# Patient Record
Sex: Female | Born: 1990 | Race: Black or African American | Hispanic: No | Marital: Married | Smoking: Current some day smoker
Health system: Southern US, Community
[De-identification: ages and names within clinical notes are randomized; demographics above are authoritative.]

## PROBLEM LIST (undated history)

## (undated) DIAGNOSIS — M1990 Unspecified osteoarthritis, unspecified site: Secondary | ICD-10-CM

## (undated) DIAGNOSIS — N809 Endometriosis, unspecified: Secondary | ICD-10-CM

## (undated) DIAGNOSIS — F319 Bipolar disorder, unspecified: Secondary | ICD-10-CM

## (undated) DIAGNOSIS — I10 Essential (primary) hypertension: Secondary | ICD-10-CM

## (undated) DIAGNOSIS — K219 Gastro-esophageal reflux disease without esophagitis: Secondary | ICD-10-CM

## (undated) DIAGNOSIS — R519 Headache, unspecified: Secondary | ICD-10-CM

## (undated) HISTORY — DX: Essential (primary) hypertension: I10

## (undated) HISTORY — PX: OTHER SURGICAL HISTORY: SHX169

---

## 2011-05-26 ENCOUNTER — Encounter (HOSPITAL_COMMUNITY): Payer: Self-pay | Admitting: *Deleted

## 2011-05-26 ENCOUNTER — Emergency Department (HOSPITAL_COMMUNITY)
Admission: EM | Admit: 2011-05-26 | Discharge: 2011-05-26 | Disposition: A | Discharge status: Home / Self Care | Payer: Self-pay | Attending: Emergency Medicine | Admitting: Emergency Medicine

## 2011-05-26 DIAGNOSIS — J039 Acute tonsillitis, unspecified: Secondary | ICD-10-CM

## 2011-05-26 DIAGNOSIS — J029 Acute pharyngitis, unspecified: Secondary | ICD-10-CM | POA: Insufficient documentation

## 2011-05-26 DIAGNOSIS — E119 Type 2 diabetes mellitus without complications: Secondary | ICD-10-CM | POA: Insufficient documentation

## 2011-05-26 DIAGNOSIS — R5381 Other malaise: Secondary | ICD-10-CM | POA: Insufficient documentation

## 2011-05-26 DIAGNOSIS — F319 Bipolar disorder, unspecified: Secondary | ICD-10-CM | POA: Insufficient documentation

## 2011-05-26 DIAGNOSIS — R51 Headache: Secondary | ICD-10-CM | POA: Insufficient documentation

## 2011-05-26 HISTORY — DX: Unspecified osteoarthritis, unspecified site: M19.90

## 2011-05-26 HISTORY — DX: Bipolar disorder, unspecified: F31.9

## 2011-05-26 LAB — RAPID STREP SCREEN (MED CTR MEBANE ONLY): Streptococcus, Group A Screen (Direct): NEGATIVE

## 2011-05-26 LAB — MONONUCLEOSIS SCREEN: Mono Screen: NEGATIVE

## 2011-05-26 MED ORDER — LIDOCAINE VISCOUS 2 % MT SOLN: 10.0000 mL | OROMUCOSAL | Status: AC | PRN

## 2011-05-26 MED ORDER — LIDOCAINE VISCOUS 2 % MT SOLN
20.0000 mL | Freq: Once | OROMUCOSAL | Status: AC
  Administered 2011-05-26: 20 mL via OROMUCOSAL
  Filled 2011-05-26: qty 30

## 2011-05-26 MED ORDER — AZITHROMYCIN 200 MG/5ML PO SUSR: 240.0000 mg | Freq: Every day | ORAL | Status: AC

## 2011-05-26 MED ORDER — AZITHROMYCIN 200 MG/5ML PO SUSR
500.0000 mg | Freq: Once | ORAL | Status: AC
  Administered 2011-05-26: 500 mg via ORAL
  Filled 2011-05-26 (×2): qty 10

## 2011-05-26 NOTE — Discharge Instructions (Signed)
Tonsillitis Tonsils are lumps of lymphoid tissues at the back of the throat. Each tonsil has 20 crevices (crypts). Tonsils help fight nose and throat infections and keep infection from spreading to other parts of the body for the first 18 months of life. Tonsillitis is an infection of the throat that causes the tonsils to become red, tender, and swollen. CAUSES Sudden and, if treated, temporary (acute) tonsillitis is usually caused by infection with streptococcal bacteria. Long lasting (chronic) tonsillitis occurs when the crypts of the tonsils become filled with pieces of food and bacteria, which makes it easy for the tonsils to become constantly infected. SYMPTOMS  Symptoms of tonsillitis include:  A sore throat.   White patches on the tonsils.   Fever.   Tiredness.  DIAGNOSIS Tonsillitis can be diagnosed through a physical exam. Diagnosis can be confirmed with the results of lab tests, including a throat culture. TREATMENT  The goals of tonsillitis treatment include the reduction of the severity and duration of symptoms, prevention of associated conditions, and prevention of disease transmission. Tonsillitis caused by bacteria can be treated with antibiotics. Usually, treatment with antibiotics is started before the cause of the tonsillitis is known. However, if it is determined that the cause is not bacterial, antibiotics will not treat the tonsillitis. If attacks of tonsillitis are severe and frequent, your caregiver may recommend surgery to remove the tonsils (tonsillectomy). HOME CARE INSTRUCTIONS   Rest as much as possible and get plenty of sleep.   Drink plenty of fluids. While the throat is very sore, eat soft foods or liquids, such as sherbet, soups, or instant breakfast drinks.   Eat frozen ice pops.   Older children and adults may gargle with a warm or cold liquid to help soothe the throat. Mix 1 teaspoon of salt in 1 cup of water.   Other family members who also develop a  sore throat or fever should have a medical exam or throat culture.   Only take over-the-counter or prescription medicines for pain, discomfort, or fever as directed by your caregiver.   If you are given antibiotics, take them as directed. Finish them even if you start to feel better.  SEEK MEDICAL CARE IF:   Your baby is older than 3 months with a rectal temperature of 100.5 F (38.1 C) or higher for more than 1 day.   Large, tender lumps develop in your neck.   A rash develops.   Green, yellow-brown, or bloody substance is coughed up.   You are unable to swallow liquids or food for 24 hours.   Your child is unable to swallow food or liquids for 12 hours.  SEEK IMMEDIATE MEDICAL CARE IF:   You develop any new symptoms such as vomiting, severe headache, stiff neck, chest pain, or trouble breathing or swallowing.   You have severe throat pain along with drooling or voice changes.   You have severe pain, unrelieved with recommended medications.   You are unable to fully open the mouth.   You develop redness, swelling, or severe pain anywhere in the neck.   You have a fever.   Your baby is older than 3 months with a rectal temperature of 102 F (38.9 C) or higher.   Your baby is 12 months old or younger with a rectal temperature of 100.4 F (38 C) or higher.  MAKE SURE YOU:   Understand these instructions.   Will watch your condition.   Will get help right away if you are not  watch your condition.   Will get help right away if you are not doing well or get worse.  Document Released: 10/12/2004 Document Revised: 12/22/2010 Document Reviewed: 03/10/2010  [employer] Patient Information 2012 [employer].

## 2011-05-26 NOTE — ED Notes (Signed)
Sore throat for 1-2 mos, Had strep test at [facility]  2 weeks ago was neg .  But was treated. Says tonsils are swollen.

## 2011-05-27 NOTE — ED Provider Notes (Signed)
History     CSN: [id number]  Arrival date & time 05/26/11  1756   First MD Initiated Contact with Patient 05/26/11 1814      Chief Complaint  Patient presents with  . Sore Throat    (Consider location/radiation/quality/duration/timing/severity/associated sxs/prior treatment) HPI Comments: Aryanah Enslow presents with a near chronic sore throat,  Swollen tonsils with white tonsils for the past 6-8 weeks.  She was seen at [facility] 2 weeks ago and reports her strep test was negative but was treated with a 10 day course of amoxil.  She never improved,  Stating her her throat is still sore,  With swollen tonsils with white patches.  She also reports subjective fever and constant fatigue.  She denies cough,  Sob,  Chest or abdominal pain.  She has not had nasal congestion ear pain but has had occasional headaches.  She is a diabetic and states her blood glucose levels have been less than 200.  The history is provided by the patient.    Past Medical History  Diagnosis Date  . Diabetes mellitus   . Arthritis   . Bipolar 1 disorder     History reviewed. No pertinent past surgical history.  History reviewed. No pertinent family history.  History  Substance Use Topics  . Smoking status: Current Everyday Smoker  . Smokeless tobacco: Not on file  . Alcohol Use: No    OB History    Grav Para Term Preterm Abortions TAB SAB Ect Mult Living                  Review of Systems  Constitutional: Positive for fatigue. Negative for fever.  HENT: Positive for sore throat. Negative for congestion, rhinorrhea, neck pain and ear discharge.   Eyes: Negative.   Respiratory: Negative for chest tightness and shortness of breath.   Cardiovascular: Negative for chest pain.  Gastrointestinal: Negative for nausea and abdominal pain.  Genitourinary: Negative.   Musculoskeletal: Negative for joint swelling and arthralgias.  Skin: Negative.  Negative for rash and wound.  Neurological:  Positive for headaches. Negative for dizziness, weakness, light-headedness and numbness.  Hematological: Negative.   Psychiatric/Behavioral: Negative.     Allergies  Other  Home Medications   Current Outpatient Rx  Name Route Sig Dispense Refill  . IBUPROFEN 200 MG PO TABS Oral Take 400-600 mg by mouth daily as needed. For pain    . SITAGLIPTIN PHOSPHATE 100 MG PO TABS Oral Take 100 mg by mouth daily.    . AZITHROMYCIN 200 MG/5ML PO SUSR Oral Take 6 mLs (240 mg total) by mouth daily. Take for 4 days 24 mL 0  . LIDOCAINE VISCOUS 2 % MT SOLN Oral Take 10 mLs by mouth as needed for pain. Gargle and spit 100 mL 0    BP 135/63  Pulse 97  Temp(Src) 99.8 F (37.7 C) (Oral)  Resp 16  Ht 5\' 8"  (1.727 m)  Wt 265 lb (120.203 kg)  BMI 40.29 kg/m2  SpO2 100%  LMP 05/17/2011  Physical Exam  Nursing note and vitals reviewed. Constitutional: She appears well-developed and well-nourished.  HENT:  Head: Normocephalic and atraumatic.  Right Ear: Tympanic membrane normal.  Left Ear: Tympanic membrane normal.  Nose: No mucosal edema or rhinorrhea.  Mouth/Throat: Uvula is midline and mucous membranes are normal. Oropharyngeal exudate and posterior oropharyngeal erythema present. No posterior oropharyngeal edema or tonsillar abscesses.  Eyes: Conjunctivae are normal.  Neck: Normal range of motion.  Cardiovascular: Normal rate, regular rhythm, normal  heart sounds and intact distal pulses.   Pulmonary/Chest: Effort normal and breath sounds normal. She has no wheezes.  Abdominal: Soft. Bowel sounds are normal. There is no tenderness.  Musculoskeletal: Normal range of motion.  Lymphadenopathy:       Head (right side): Tonsillar and posterior auricular adenopathy present.       Head (left side): Tonsillar and posterior auricular adenopathy present.  Neurological: She is alert.  Skin: Skin is warm and dry.  Psychiatric: She has a normal mood and affect.    ED Course  Procedures (including  critical care time)   Labs Reviewed  MONONUCLEOSIS SCREEN  RAPID STREP SCREEN  LAB REPORT - SCANNED   No results found.   1. Tonsillitis       MDM  Labs reviewed prior to dc home.  Patient placed on zpack,  First 500 mg dose given in ed.  Lidocaine 2% viscous.  Referral to pcp for a recheck if not improving.  No PE evidence for peritonsillar abscess.  No respiratory distress or stridor.        Burgess Amor, PA 05/27/11 2203  Burgess Amor, PA 05/27/11 2204

## 2011-05-28 NOTE — ED Provider Notes (Signed)
Medical screening examination/treatment/procedure(s) were performed by non-physician practitioner and as supervising physician I was immediately available for consultation/collaboration.  Donnetta Hutching, MD 05/28/11 1610

## 2014-02-03 ENCOUNTER — Emergency Department (HOSPITAL_COMMUNITY)
Admission: EM | Admit: 2014-02-03 | Discharge: 2014-02-03 | Disposition: A | Discharge status: Home / Self Care | Payer: Self-pay | Attending: Emergency Medicine | Admitting: Emergency Medicine

## 2014-02-03 ENCOUNTER — Encounter (HOSPITAL_COMMUNITY): Payer: Self-pay | Admitting: Physical Medicine and Rehabilitation

## 2014-02-03 DIAGNOSIS — Y998 Other external cause status: Secondary | ICD-10-CM | POA: Insufficient documentation

## 2014-02-03 DIAGNOSIS — Y9389 Activity, other specified: Secondary | ICD-10-CM | POA: Insufficient documentation

## 2014-02-03 DIAGNOSIS — S80862A Insect bite (nonvenomous), left lower leg, initial encounter: Secondary | ICD-10-CM | POA: Insufficient documentation

## 2014-02-03 DIAGNOSIS — Z8659 Personal history of other mental and behavioral disorders: Secondary | ICD-10-CM | POA: Insufficient documentation

## 2014-02-03 DIAGNOSIS — Y9289 Other specified places as the place of occurrence of the external cause: Secondary | ICD-10-CM | POA: Insufficient documentation

## 2014-02-03 DIAGNOSIS — B349 Viral infection, unspecified: Secondary | ICD-10-CM | POA: Insufficient documentation

## 2014-02-03 DIAGNOSIS — Z720 Tobacco use: Secondary | ICD-10-CM | POA: Insufficient documentation

## 2014-02-03 DIAGNOSIS — Z79899 Other long term (current) drug therapy: Secondary | ICD-10-CM | POA: Insufficient documentation

## 2014-02-03 DIAGNOSIS — J0100 Acute maxillary sinusitis, unspecified: Secondary | ICD-10-CM | POA: Insufficient documentation

## 2014-02-03 DIAGNOSIS — E119 Type 2 diabetes mellitus without complications: Secondary | ICD-10-CM | POA: Insufficient documentation

## 2014-02-03 DIAGNOSIS — W57XXXA Bitten or stung by nonvenomous insect and other nonvenomous arthropods, initial encounter: Secondary | ICD-10-CM | POA: Insufficient documentation

## 2014-02-03 DIAGNOSIS — Z8739 Personal history of other diseases of the musculoskeletal system and connective tissue: Secondary | ICD-10-CM | POA: Insufficient documentation

## 2014-02-03 MED ORDER — PROMETHAZINE HCL 25 MG PO TABS: 25.0000 mg | ORAL_TABLET | Freq: Four times a day (QID) | ORAL | Status: DC | PRN

## 2014-02-03 MED ORDER — SULFAMETHOXAZOLE-TRIMETHOPRIM 800-160 MG PO TABS: 1.0000 | ORAL_TABLET | Freq: Two times a day (BID) | ORAL | Status: DC

## 2014-02-03 NOTE — ED Notes (Signed)
Declined W/C at D/C and was escorted to lobby by RN. 

## 2014-02-03 NOTE — ED Provider Notes (Signed)
CSN: [id number]     Arrival date & time 02/03/14  0830 History  This chart was scribed for non-physician practitioner, Alvina Chou, PA-C, working with Leota Jacobsen, MD by Ladene Artist, ED Scribe. This patient was seen in room TR11C/TR11C and the patient's care was started at 9:35 AM.   Chief Complaint  Patient presents with  . Insect Bite   The history is provided by the patient. No language interpreter was used.   HPI Comments: Tara Estes is a 24 y.o. female, with a h/o DM, bipolar disorder, arthritis, who presents to the Emergency Department complaining of alleged insect bite to L lower leg first noted a few days ago. Pt reports associated pain that radiates from L knee to L toes. Pt describes pain as a numb, aching sensation. Pain is exacerbated with sitting.   Pt also presents with multiple episodes of emesis over the past 3 days. She reports associated productive cough, sinus pressure, postnasal drip, intermittent nausea. Pt has been treating with Alka seltzer day and night without relief.   Past Medical History  Diagnosis Date  . Diabetes mellitus   . Arthritis   . Bipolar 1 disorder    History reviewed. No pertinent past surgical history. No family history on file. History  Substance Use Topics  . Smoking status: Current Every Day Smoker    Types: Cigarettes  . Smokeless tobacco: Not on file  . Alcohol Use: Yes   OB History    No data available     Review of Systems  HENT: Positive for postnasal drip and sinus pressure.   Respiratory: Positive for cough.   Gastrointestinal: Positive for nausea and vomiting.  Musculoskeletal: Positive for myalgias.  Skin: Positive for rash.  Neurological: Positive for numbness.  All other systems reviewed and are negative.  Allergies  Other  Home Medications   Prior to Admission medications   Medication Sig Start Date End Date Taking? Authorizing Provider  ibuprofen (ADVIL,MOTRIN) 200 MG tablet Take 400-600 mg by  mouth daily as needed. For pain    Historical Provider, MD  sitaGLIPtin (JANUVIA) 100 MG tablet Take 100 mg by mouth daily.    Historical Provider, MD   Triage Vitals: BP 144/76 mmHg  Pulse 93  Temp(Src) 97.7 F (36.5 C) (Oral)  Resp 18  Ht 5\' 8"  (1.727 m)  Wt 280 lb (127.007 kg)  BMI 42.58 kg/m2  SpO2 100% Physical Exam  Constitutional: She is oriented to person, place, and time. She appears well-developed and well-nourished. No distress.  HENT:  Head: Normocephalic and atraumatic.  Nose: Right sinus exhibits maxillary sinus tenderness. Left sinus exhibits maxillary sinus tenderness.  Mouth/Throat: Oropharynx is clear and moist. No oropharyngeal exudate, posterior oropharyngeal edema or posterior oropharyngeal erythema.  Eyes: Conjunctivae and EOM are normal.  Neck: Neck supple.  Cardiovascular: Normal rate, regular rhythm and normal heart sounds.   Pulmonary/Chest: Effort normal and breath sounds normal.  Musculoskeletal: Normal range of motion.  Lymphadenopathy:    She has no cervical adenopathy.  Neurological: She is alert and oriented to person, place, and time.  Skin: Skin is warm and dry.  2 x 1 cm area of localized erythema and edema of the L lateral leg just distal to the knee. No tenderness to palpation or open wound.   Psychiatric: She has a normal mood and affect. Her behavior is normal.  Nursing note and vitals reviewed.  ED Course  Procedures (including critical care time) DIAGNOSTIC STUDIES: Oxygen Saturation is 100% on  RA, normal by my interpretation.    COORDINATION OF CARE: 9:40 AM-Discussed treatment plan which includes Septra DS and Phenergan with pt at bedside and pt agreed to plan.   Labs Review Labs Reviewed - No data to display  Imaging Review No results found.   EKG Interpretation None      MDM   Final diagnoses:  Acute maxillary sinusitis, recurrence not specified  Viral illness  Insect bite    Patient will have Bactrim and Phenergan  for symptoms. Vitals stable and patient afebrile. No further evaluation needed at this time.   I personally performed the services described in this documentation, which was scribed in my presence. The recorded information has been reviewed and is accurate.    Alvina Chou, PA-C 02/05/14 2036  Leota Jacobsen, MD 02/08/14 [phone]

## 2014-02-03 NOTE — ED Notes (Signed)
Pt presents to department for evaluation of possible insect bite to L lower leg. Noticed several days ago. Also states multiple insect bites all over body recently. Small red swollen area noted to L lower leg. Pt is alert and oriented x4.

## 2014-02-03 NOTE — Discharge Instructions (Signed)
Take Bactrim as directed until gone. Take Phenergan as needed for nausea. Refer to attached documents for more information. Return to the ED with worsening or concerning symptoms.

## 2014-11-01 ENCOUNTER — Emergency Department (HOSPITAL_COMMUNITY): Payer: Worker's Compensation

## 2014-11-01 ENCOUNTER — Encounter (HOSPITAL_COMMUNITY): Payer: Self-pay | Admitting: Nurse Practitioner

## 2014-11-01 ENCOUNTER — Emergency Department (HOSPITAL_COMMUNITY)
Admission: EM | Admit: 2014-11-01 | Discharge: 2014-11-01 | Disposition: A | Discharge status: Home / Self Care | Payer: Worker's Compensation | Attending: Emergency Medicine | Admitting: Emergency Medicine

## 2014-11-01 DIAGNOSIS — S3992XA Unspecified injury of lower back, initial encounter: Secondary | ICD-10-CM | POA: Insufficient documentation

## 2014-11-01 DIAGNOSIS — M25519 Pain in unspecified shoulder: Secondary | ICD-10-CM

## 2014-11-01 DIAGNOSIS — Z720 Tobacco use: Secondary | ICD-10-CM | POA: Diagnosis not present

## 2014-11-01 DIAGNOSIS — Z8659 Personal history of other mental and behavioral disorders: Secondary | ICD-10-CM | POA: Insufficient documentation

## 2014-11-01 DIAGNOSIS — Z792 Long term (current) use of antibiotics: Secondary | ICD-10-CM | POA: Diagnosis not present

## 2014-11-01 DIAGNOSIS — M1990 Unspecified osteoarthritis, unspecified site: Secondary | ICD-10-CM | POA: Diagnosis not present

## 2014-11-01 DIAGNOSIS — Y998 Other external cause status: Secondary | ICD-10-CM | POA: Diagnosis not present

## 2014-11-01 DIAGNOSIS — Z79899 Other long term (current) drug therapy: Secondary | ICD-10-CM | POA: Insufficient documentation

## 2014-11-01 DIAGNOSIS — S4992XA Unspecified injury of left shoulder and upper arm, initial encounter: Secondary | ICD-10-CM | POA: Diagnosis not present

## 2014-11-01 DIAGNOSIS — W208XXA Other cause of strike by thrown, projected or falling object, initial encounter: Secondary | ICD-10-CM | POA: Insufficient documentation

## 2014-11-01 DIAGNOSIS — S199XXA Unspecified injury of neck, initial encounter: Secondary | ICD-10-CM | POA: Diagnosis not present

## 2014-11-01 DIAGNOSIS — E119 Type 2 diabetes mellitus without complications: Secondary | ICD-10-CM | POA: Diagnosis not present

## 2014-11-01 DIAGNOSIS — M542 Cervicalgia: Secondary | ICD-10-CM

## 2014-11-01 DIAGNOSIS — Y9289 Other specified places as the place of occurrence of the external cause: Secondary | ICD-10-CM | POA: Insufficient documentation

## 2014-11-01 DIAGNOSIS — Y9389 Activity, other specified: Secondary | ICD-10-CM | POA: Diagnosis not present

## 2014-11-01 MED ORDER — IBUPROFEN 600 MG PO TABS: 600.0000 mg | ORAL_TABLET | Freq: Four times a day (QID) | ORAL | Status: DC | PRN

## 2014-11-01 MED ORDER — KETOROLAC TROMETHAMINE 60 MG/2ML IM SOLN
60.0000 mg | Freq: Once | INTRAMUSCULAR | Status: AC
  Administered 2014-11-01: 60 mg via INTRAMUSCULAR
  Filled 2014-11-01: qty 2

## 2014-11-01 NOTE — Discharge Instructions (Signed)
You were evaluated in the ED today for your left shoulder and neck pain. There does not appear to be an emergent cause for her symptoms at this time. Ear exam was reassuring. Her x-ray showed no broken bones or dislocations. Please continue taking Motrin for your discomfort and swelling. You may also benefit from hot showers to help with the pain. Follow-up with your doctor as needed. Return to ED for worsening symptoms.  Musculoskeletal Pain Musculoskeletal pain is muscle and boney aches and pains. These pains can occur in any part of the body. Your caregiver may treat you without knowing the cause of the pain. They may treat you if blood or urine tests, X-rays, and other tests were normal.  CAUSES There is often not a definite cause or reason for these pains. These pains may be caused by a type of germ (virus). The discomfort may also come from overuse. Overuse includes working out too hard when your body is not fit. Boney aches also come from weather changes. Bone is sensitive to atmospheric pressure changes. HOME CARE INSTRUCTIONS   Ask when your test results will be ready. Make sure you get your test results.  Only take over-the-counter or prescription medicines for pain, discomfort, or fever as directed by your caregiver. If you were given medications for your condition, do not drive, operate machinery or power tools, or sign legal documents for 24 hours. Do not drink alcohol. Do not take sleeping pills or other medications that may interfere with treatment.  Continue all activities unless the activities cause more pain. When the pain lessens, slowly resume normal activities. Gradually increase the intensity and duration of the activities or exercise.  During periods of severe pain, bed rest may be helpful. Lay or sit in any position that is comfortable.  Putting ice on the injured area.  Put ice in a bag.  Place a towel between your skin and the bag.  Leave the ice on for 15 to 20  minutes, 3 to 4 times a day.  Follow up with your caregiver for continued problems and no reason can be found for the pain. If the pain becomes worse or does not go away, it may be necessary to repeat tests or do additional testing. Your caregiver may need to look further for a possible cause. SEEK IMMEDIATE MEDICAL CARE IF:  You have pain that is getting worse and is not relieved by medications.  You develop chest pain that is associated with shortness or breath, sweating, feeling sick to your stomach (nauseous), or throw up (vomit).  Your pain becomes localized to the abdomen.  You develop any new symptoms that seem different or that concern you. MAKE SURE YOU:   Understand these instructions.  Will watch your condition.  Will get help right away if you are not doing well or get worse.   This information is not intended to replace advice given to you by your health care provider. Make sure you discuss any questions you have with your health care provider.   Document Released: 01/02/2005 Document Revised: 03/27/2011 Document Reviewed: 09/06/2012 Elsevier Interactive Patient Education [employer].

## 2014-11-01 NOTE — ED Provider Notes (Signed)
CSN: [id number]     Arrival date & time 11/01/14  1846 History  By signing my name below, I, Tara Estes, attest that this documentation has been prepared under the direction and in the presence of [employer], PA-C. Electronically Signed: Randa Estes, ED Scribe. 11/01/2014. 8:51 PM.      Chief Complaint  Patient presents with  . Back Pain   Patient is a 24 y.o. female presenting with back pain. The history is provided by the patient. No language interpreter was used.  Back Pain  HPI Comments: Tara Estes is a 24 y.o. female who presents to the Emergency Department complaining of burning back pain after having a bakers rack fell onto her 1 day ago. Pt states that as the rack was falling she tried to catch it and thinks she may have pulled something due to being in an awkward position. Pt states she has tried ibuprofen with slight relief. Pt states she has also tried [place].  Pt states that the pain was worse today when she started moving again at work.  Currently rates discomfort as a 5/10. Pain characterized as sharp and burning. Denies fevers, chills, numbness or weakness.  Past Medical History  Diagnosis Date  . Diabetes mellitus   . Arthritis   . Bipolar 1 disorder ([place])    History reviewed. No pertinent past surgical history. No family history on file. Social History  Substance Use Topics  . Smoking status: Current Every Day Smoker    Types: Cigarettes  . Smokeless tobacco: None  . Alcohol Use: Yes   OB History    No data available     Review of Systems  Musculoskeletal: Positive for back pain.  All other systems reviewed and are negative.     Allergies  Other  Home Medications   Prior to Admission medications   Medication Sig Start Date End Date Taking? Authorizing Provider  ibuprofen (ADVIL,MOTRIN) 600 MG tablet Take 1 tablet (600 mg total) by mouth every 6 (six) hours as needed. 11/01/14   Comer Locket, PA-C  promethazine (PHENERGAN) 25  MG tablet Take 1 tablet (25 mg total) by mouth every 6 (six) hours as needed for nausea or vomiting. 02/03/14   Kaitlyn Szekalski, PA-C  sitaGLIPtin (JANUVIA) 100 MG tablet Take 100 mg by mouth daily.    Historical Provider, MD  sulfamethoxazole-trimethoprim (SEPTRA DS) 800-160 MG per tablet Take 1 tablet by mouth every 12 (twelve) hours. 02/03/14   Kaitlyn Szekalski, PA-C   BP 136/73 mmHg  Pulse 88  Temp(Src) 99 F (37.2 C) (Oral)  Resp 16  Ht 5\' 7"  (1.702 m)  Wt 275 lb (124.739 kg)  BMI 43.06 kg/m2  SpO2 100%  LMP 10/18/2014   Physical Exam  Constitutional: She is oriented to person, place, and time. She appears well-developed and well-nourished. No distress.  Awake, alert, nontoxic appearance. Obese, African-American female  HENT:  Head: Normocephalic and atraumatic.  Eyes: Conjunctivae and EOM are normal. Right eye exhibits no discharge. Left eye exhibits no discharge.  Neck: Normal range of motion. Neck supple. No tracheal deviation present.  Cardiovascular: Normal rate, normal heart sounds and intact distal pulses.   Pulmonary/Chest: Effort normal. No respiratory distress.  Abdominal: Soft. There is no tenderness. There is no rebound.  Musculoskeletal: Normal range of motion. She exhibits no tenderness.  Full active range of motion of bilateral upper extremities, CTL-spine. Tenderness diffusely throughout left trapezius and left scapular region. No erythema, lesions or other deformities noted. No bony crepitus. No  step-offs. No midline bony tenderness.  Neurological: She is alert and oriented to person, place, and time.  Mental status and motor strength appears baseline for patient and situation. Sensation intact to light touch. Gait baseline  Skin: Skin is warm and dry. No rash noted.  Psychiatric: She has a normal mood and affect. Her behavior is normal.  Nursing note and vitals reviewed.   ED Course  Procedures (including critical care time) DIAGNOSTIC STUDIES: Oxygen  Saturation is 100% on RA, normal by my interpretation.    COORDINATION OF CARE: 7:17 PM-Discussed treatment plan with pt at bedside and pt agreed to plan.     Labs Review Labs Reviewed - No data to display  Imaging Review Dg Scapula Left  11/01/2014  CLINICAL DATA:  complaining of burning back pain after having a bakers rack fell onto her yesterday. Pt states that as the rack was falling she tried to catch it and thinks she may have pulled something due to being in an awkward position. EXAM: LEFT SCAPULA - 2+ VIEWS COMPARISON:  None. FINDINGS: There is no evidence of fracture or other focal bone lesions. Soft tissues are unremarkable. IMPRESSION: Negative. Electronically Signed   By: Lajean Manes M.D.   On: 11/01/2014 20:37      EKG Interpretation None     Meds given in ED:  Medications  ketorolac (TORADOL) injection 60 mg (60 mg Intramuscular Given 11/01/14 1937)    New Prescriptions   IBUPROFEN (ADVIL,MOTRIN) 600 MG TABLET    Take 1 tablet (600 mg total) by mouth every 6 (six) hours as needed.   Filed Vitals:   11/01/14 1859  BP: 136/73  Pulse: 88  Temp: 99 F (37.2 C)  TempSrc: Oral  Resp: 16  Height: 5\' 7"  (1.702 m)  Weight: 275 lb (124.739 kg)  SpO2: 100%    MDM  Vitals stable - WNL -afebrile Pt resting comfortably in ED. PE--physical exam as above. Left trapezius and scapular pain. Full range of motion. No focal neurological deficits.  DDX--patient with right-sided low back pain after a shelf fell and hit her on shoulder. Treated in the ED with Toradol for pain. Patient expresses some relief. Will continue trial of NSAIDs and have patient follow-up with PCP for reevaluation. No pathologic back pain red flags. No evidence of other acute or emergent pathology at this time. Will DC with NSAIDs. I discussed all relevant lab findings and imaging results with pt and they verbalized understanding. Discussed f/u with PCP within 48 hrs and return precautions, pt very  amenable to plan.   Final diagnoses:  Neck and shoulder pain   I personally performed the services described in this documentation, which was scribed in my presence. The recorded information has been reviewed and is accurate.      Comer Locket, PA-C 11/01/14 2052  Charlesetta Shanks, MD 11/07/14 [phone]

## 2014-11-01 NOTE — ED Notes (Signed)
Pt sts last night bakers rack fell on her back. Pt used ice/heat  And ibuprofen with minimal relief. Pt ambulatory, steady gait, full ROM

## 2014-11-01 NOTE — ED Notes (Signed)
Reviewed discharge instructions and prescription medication with patient, who verbalized understanding.

## 2015-02-05 ENCOUNTER — Encounter (HOSPITAL_COMMUNITY): Payer: Self-pay | Admitting: Emergency Medicine

## 2015-02-05 DIAGNOSIS — Z8659 Personal history of other mental and behavioral disorders: Secondary | ICD-10-CM | POA: Diagnosis not present

## 2015-02-05 DIAGNOSIS — E119 Type 2 diabetes mellitus without complications: Secondary | ICD-10-CM | POA: Insufficient documentation

## 2015-02-05 DIAGNOSIS — R0602 Shortness of breath: Secondary | ICD-10-CM | POA: Insufficient documentation

## 2015-02-05 DIAGNOSIS — R51 Headache: Secondary | ICD-10-CM | POA: Diagnosis not present

## 2015-02-05 DIAGNOSIS — F17210 Nicotine dependence, cigarettes, uncomplicated: Secondary | ICD-10-CM | POA: Diagnosis not present

## 2015-02-05 DIAGNOSIS — J029 Acute pharyngitis, unspecified: Secondary | ICD-10-CM | POA: Insufficient documentation

## 2015-02-05 DIAGNOSIS — R509 Fever, unspecified: Secondary | ICD-10-CM | POA: Diagnosis not present

## 2015-02-05 DIAGNOSIS — R112 Nausea with vomiting, unspecified: Secondary | ICD-10-CM | POA: Diagnosis not present

## 2015-02-05 DIAGNOSIS — R05 Cough: Secondary | ICD-10-CM | POA: Insufficient documentation

## 2015-02-05 DIAGNOSIS — Z79899 Other long term (current) drug therapy: Secondary | ICD-10-CM | POA: Insufficient documentation

## 2015-02-05 DIAGNOSIS — M1990 Unspecified osteoarthritis, unspecified site: Secondary | ICD-10-CM | POA: Insufficient documentation

## 2015-02-05 DIAGNOSIS — Z792 Long term (current) use of antibiotics: Secondary | ICD-10-CM | POA: Insufficient documentation

## 2015-02-05 LAB — CBC
HCT: 33.2 % — ABNORMAL LOW (ref 36.0–46.0)
Hemoglobin: 10.4 g/dL — ABNORMAL LOW (ref 12.0–15.0)
MCH: 24.5 pg — ABNORMAL LOW (ref 26.0–34.0)
MCHC: 31.3 g/dL (ref 30.0–36.0)
MCV: 78.1 fL (ref 78.0–100.0)
PLATELETS: 301 10*3/uL (ref 150–400)
RBC: 4.25 MIL/uL (ref 3.87–5.11)
RDW: 14.5 % (ref 11.5–15.5)
WBC: 8.4 10*3/uL (ref 4.0–10.5)

## 2015-02-05 MED ORDER — ONDANSETRON 4 MG PO TBDP
ORAL_TABLET | ORAL | Status: AC
  Filled 2015-02-05: qty 1

## 2015-02-05 MED ORDER — ONDANSETRON 4 MG PO TBDP
4.0000 mg | ORAL_TABLET | Freq: Once | ORAL | Status: AC | PRN
  Administered 2015-02-05: 4 mg via ORAL

## 2015-02-05 NOTE — ED Notes (Signed)
C/o cough and sore throat since Tuesday.  Reports headache since Wednesday.  States she coughed up blood yesterday and vomited blood "pizza sauce colored" today.  Denies abd pain.

## 2015-02-06 ENCOUNTER — Emergency Department (HOSPITAL_COMMUNITY)
Admission: EM | Admit: 2015-02-06 | Discharge: 2015-02-06 | Disposition: A | Discharge status: Home / Self Care | Payer: Worker's Compensation | Attending: Emergency Medicine | Admitting: Emergency Medicine

## 2015-02-06 ENCOUNTER — Emergency Department (HOSPITAL_COMMUNITY): Payer: Worker's Compensation

## 2015-02-06 DIAGNOSIS — R0602 Shortness of breath: Secondary | ICD-10-CM

## 2015-02-06 DIAGNOSIS — R112 Nausea with vomiting, unspecified: Secondary | ICD-10-CM

## 2015-02-06 LAB — URINALYSIS, ROUTINE W REFLEX MICROSCOPIC
BILIRUBIN URINE: NEGATIVE
Glucose, UA: NEGATIVE mg/dL
Hgb urine dipstick: NEGATIVE
KETONES UR: NEGATIVE mg/dL
Leukocytes, UA: NEGATIVE
NITRITE: NEGATIVE
PH: 6.5 (ref 5.0–8.0)
PROTEIN: NEGATIVE mg/dL
Specific Gravity, Urine: 1.023 (ref 1.005–1.030)

## 2015-02-06 LAB — COMPREHENSIVE METABOLIC PANEL
ALBUMIN: 3.6 g/dL (ref 3.5–5.0)
ALT: 17 U/L (ref 14–54)
AST: 20 U/L (ref 15–41)
Alkaline Phosphatase: 83 U/L (ref 38–126)
Anion gap: 9 (ref 5–15)
BUN: 12 mg/dL (ref 6–20)
CALCIUM: 8.9 mg/dL (ref 8.9–10.3)
CHLORIDE: 108 mmol/L (ref 101–111)
CO2: 23 mmol/L (ref 22–32)
Creatinine, Ser: 0.77 mg/dL (ref 0.44–1.00)
GFR calc Af Amer: >60 mL/min (ref >60)
GLUCOSE: 104 mg/dL — AB (ref 65–99)
Potassium: 3.8 mmol/L (ref 3.5–5.1)
Sodium: 140 mmol/L (ref 135–145)
TOTAL PROTEIN: 7.6 g/dL (ref 6.5–8.1)
Total Bilirubin: 0.3 mg/dL (ref 0.3–1.2)

## 2015-02-06 LAB — LIPASE, BLOOD: LIPASE: 32 U/L (ref 11–51)

## 2015-02-06 MED ORDER — METOCLOPRAMIDE HCL 5 MG/ML IJ SOLN
10.0000 mg | Freq: Once | INTRAMUSCULAR | Status: AC
  Administered 2015-02-06: 10 mg via INTRAVENOUS
  Filled 2015-02-06: qty 2

## 2015-02-06 MED ORDER — SODIUM CHLORIDE 0.9 % IV BOLUS (SEPSIS)
2000.0000 mL | Freq: Once | INTRAVENOUS | Status: AC
  Administered 2015-02-06: 2000 mL via INTRAVENOUS

## 2015-02-06 MED ORDER — ONDANSETRON HCL 4 MG/2ML IJ SOLN
4.0000 mg | Freq: Once | INTRAMUSCULAR | Status: AC
  Administered 2015-02-06: 4 mg via INTRAVENOUS
  Filled 2015-02-06: qty 2

## 2015-02-06 MED ORDER — DIPHENHYDRAMINE HCL 50 MG/ML IJ SOLN
12.5000 mg | Freq: Once | INTRAMUSCULAR | Status: AC
  Administered 2015-02-06: 12.5 mg via INTRAVENOUS
  Filled 2015-02-06: qty 1

## 2015-02-06 MED ORDER — LORAZEPAM 2 MG/ML IJ SOLN
1.0000 mg | Freq: Once | INTRAMUSCULAR | Status: AC
  Administered 2015-02-06: 1 mg via INTRAVENOUS
  Filled 2015-02-06: qty 1

## 2015-02-06 MED ORDER — SODIUM CHLORIDE 0.9 % IV SOLN: INTRAVENOUS | Status: DC

## 2015-02-06 MED ORDER — PANTOPRAZOLE SODIUM 40 MG IV SOLR
40.0000 mg | Freq: Once | INTRAVENOUS | Status: AC
  Administered 2015-02-06: 40 mg via INTRAVENOUS
  Filled 2015-02-06: qty 40

## 2015-02-06 NOTE — ED Provider Notes (Signed)
CSN: [id number]     Arrival date & time 02/05/15  2309 History  By signing my name below, I, Julien Nordmann, attest that this documentation has been prepared under the direction and in the presence of Lacretia Leigh, MD. Electronically Signed: Julien Nordmann, ED Scribe. 02/06/2015. 12:24 AM.    Chief Complaint  Patient presents with  . Hematemesis  . Cough  . Sore Throat  . Headache      The history is provided by the patient. No language interpreter was used.   HPI Comments: Tara Estes is a 25 y.o. female who has a hx of DM presents to the Emergency Department complaining of intermittent, gradual worsening hematemesis onset last night. She has been having associated sore throat, headache, subjective fever, and cough that started 4 days ago. Pt has vomited about 6-7 times today. Pt states she has been taking a lot of ibuprofen lately to try and alleviate her back pain instead of taking stronger medication that she received from her PCP. She denies black stool and abdominal pain. Pt smokes about 3 cigarettes a day.  Past Medical History  Diagnosis Date  . Diabetes mellitus   . Arthritis   . Bipolar 1 disorder ([place])    History reviewed. No pertinent past surgical history. No family history on file. Social History  Substance Use Topics  . Smoking status: Current Every Day Smoker    Types: Cigarettes  . Smokeless tobacco: None  . Alcohol Use: Yes   OB History    No data available     Review of Systems  Constitutional: Positive for fever.  Respiratory: Positive for cough.   Gastrointestinal: Positive for vomiting. Negative for abdominal pain.  Neurological: Positive for headaches.  All other systems reviewed and are negative.     Allergies  Other  Home Medications   Prior to Admission medications   Medication Sig Start Date End Date Taking? Authorizing Provider  ibuprofen (ADVIL,MOTRIN) 600 MG tablet Take 1 tablet (600 mg total) by mouth every 6 (six) hours as  needed. 11/01/14   Comer Locket, PA-C  promethazine (PHENERGAN) 25 MG tablet Take 1 tablet (25 mg total) by mouth every 6 (six) hours as needed for nausea or vomiting. 02/03/14   Kaitlyn Szekalski, PA-C  sitaGLIPtin (JANUVIA) 100 MG tablet Take 100 mg by mouth daily.    Historical Provider, MD  sulfamethoxazole-trimethoprim (SEPTRA DS) 800-160 MG per tablet Take 1 tablet by mouth every 12 (twelve) hours. 02/03/14   Alvina Chou, PA-C   Triage vitals: BP 157/84 mmHg  Pulse 114  Temp(Src) 98.7 F (37.1 C) (Oral)  Resp 18  SpO2 100%  LMP 01/10/2015 Physical Exam  Constitutional: She is oriented to person, place, and time. She appears well-developed and well-nourished.  Non-toxic appearance. No distress.  HENT:  Head: Normocephalic and atraumatic.  Eyes: Conjunctivae, EOM and lids are normal. Pupils are equal, round, and reactive to light.  Neck: Normal range of motion. Neck supple. No tracheal deviation present. No thyroid mass present.  Cardiovascular: Normal rate, regular rhythm and normal heart sounds.  Exam reveals no gallop.   No murmur heard. Pulmonary/Chest: Effort normal and breath sounds normal. No stridor. No respiratory distress. She has no decreased breath sounds. She has no wheezes. She has no rhonchi. She has no rales.  Lungs clear bilaterally  Abdominal: Soft. Normal appearance and bowel sounds are normal. She exhibits no distension. There is no tenderness. There is no rebound and no CVA tenderness.  Musculoskeletal: Normal range of  motion. She exhibits no edema or tenderness.  Neurological: She is alert and oriented to person, place, and time. She has normal strength. No cranial nerve deficit or sensory deficit. GCS eye subscore is 4. GCS verbal subscore is 5. GCS motor subscore is 6.  Skin: Skin is warm and dry. No abrasion and no rash noted.  Psychiatric: She has a normal mood and affect. Her speech is normal and behavior is normal.  Nursing note and vitals  reviewed.   ED Course  Procedures  DIAGNOSTIC STUDIES: Oxygen Saturation is 100% on RA, normal by my interpretation.  COORDINATION OF CARE:  12:22 AM Discussed treatment plan which includes IV fluids with pt at bedside and pt agreed to plan.  Labs Review Labs Reviewed  CBC - Abnormal; Notable for the following:    Hemoglobin 10.4 (*)    HCT 33.2 (*)    MCH 24.5 (*)    All other components within normal limits  URINALYSIS, ROUTINE W REFLEX MICROSCOPIC (NOT AT [facility]) - Abnormal; Notable for the following:    APPearance CLOUDY (*)    All other components within normal limits  LIPASE, BLOOD  COMPREHENSIVE METABOLIC PANEL    Imaging Review No results found. I have personally reviewed and evaluated these images and lab results as part of my medical decision-making.   EKG Interpretation None      MDM   Final diagnoses:  None   I personally performed the services described in this documentation, which was scribed in my presence. The recorded information has been reviewed and is accurate.   Patient given IV fluids and medication for headache and feels better. Suspect viral illness and patient stable for discharge  Lacretia Leigh, MD 02/06/15 [phone]

## 2015-02-06 NOTE — ED Notes (Signed)
MD at bedside. 

## 2015-02-06 NOTE — Discharge Instructions (Signed)

## 2015-02-06 NOTE — ED Notes (Signed)
Patient transported to X-ray 

## 2015-07-02 ENCOUNTER — Encounter (HOSPITAL_COMMUNITY): Payer: Self-pay | Admitting: Emergency Medicine

## 2015-07-02 DIAGNOSIS — Z79899 Other long term (current) drug therapy: Secondary | ICD-10-CM | POA: Insufficient documentation

## 2015-07-02 DIAGNOSIS — R112 Nausea with vomiting, unspecified: Secondary | ICD-10-CM | POA: Insufficient documentation

## 2015-07-02 DIAGNOSIS — E119 Type 2 diabetes mellitus without complications: Secondary | ICD-10-CM | POA: Insufficient documentation

## 2015-07-02 DIAGNOSIS — F17210 Nicotine dependence, cigarettes, uncomplicated: Secondary | ICD-10-CM | POA: Insufficient documentation

## 2015-07-02 LAB — COMPREHENSIVE METABOLIC PANEL
ALBUMIN: 3.3 g/dL — AB (ref 3.5–5.0)
ALT: 14 U/L (ref 14–54)
AST: 18 U/L (ref 15–41)
Alkaline Phosphatase: 87 U/L (ref 38–126)
Anion gap: 7 (ref 5–15)
BUN: 9 mg/dL (ref 6–20)
CHLORIDE: 107 mmol/L (ref 101–111)
CO2: 23 mmol/L (ref 22–32)
Calcium: 9.2 mg/dL (ref 8.9–10.3)
Creatinine, Ser: 0.78 mg/dL (ref 0.44–1.00)
GFR calc Af Amer: >60 mL/min (ref >60)
Glucose, Bld: 109 mg/dL — ABNORMAL HIGH (ref 65–99)
POTASSIUM: 3.6 mmol/L (ref 3.5–5.1)
SODIUM: 137 mmol/L (ref 135–145)
Total Bilirubin: 0.2 mg/dL — ABNORMAL LOW (ref 0.3–1.2)
Total Protein: 7.7 g/dL (ref 6.5–8.1)

## 2015-07-02 LAB — CBC
HEMATOCRIT: 34.1 % — AB (ref 36.0–46.0)
Hemoglobin: 10.2 g/dL — ABNORMAL LOW (ref 12.0–15.0)
MCH: 22.5 pg — ABNORMAL LOW (ref 26.0–34.0)
MCHC: 29.9 g/dL — ABNORMAL LOW (ref 30.0–36.0)
MCV: 75.3 fL — AB (ref 78.0–100.0)
Platelets: 344 10*3/uL (ref 150–400)
RBC: 4.53 MIL/uL (ref 3.87–5.11)
RDW: 14.2 % (ref 11.5–15.5)
WBC: 7.6 10*3/uL (ref 4.0–10.5)

## 2015-07-02 LAB — I-STAT TROPONIN, ED: Troponin i, poc: 0 ng/mL (ref 0.00–0.08)

## 2015-07-02 LAB — URINALYSIS, ROUTINE W REFLEX MICROSCOPIC
Bilirubin Urine: NEGATIVE
GLUCOSE, UA: NEGATIVE mg/dL
Hgb urine dipstick: NEGATIVE
KETONES UR: NEGATIVE mg/dL
LEUKOCYTES UA: NEGATIVE
Nitrite: NEGATIVE
PH: 6 (ref 5.0–8.0)
Protein, ur: NEGATIVE mg/dL
Specific Gravity, Urine: 1.026 (ref 1.005–1.030)

## 2015-07-02 LAB — LIPASE, BLOOD: LIPASE: 29 U/L (ref 11–51)

## 2015-07-02 MED ORDER — ONDANSETRON 4 MG PO TBDP
ORAL_TABLET | ORAL | Status: AC
  Filled 2015-07-02: qty 1

## 2015-07-02 MED ORDER — ONDANSETRON 4 MG PO TBDP
4.0000 mg | ORAL_TABLET | Freq: Once | ORAL | Status: AC | PRN
  Administered 2015-07-02: 4 mg via ORAL

## 2015-07-02 NOTE — ED Notes (Signed)
Patient arrives with complaint of emesis for 2 days. States sudden onset at that time. Initially at onset she states sensation of tightness in chest coupled with dizziness those symptoms subsided and the emesis commenced at that time. Now states she hasn't been able to consume anything without vomiting. Also endorses generalized body aches.

## 2015-07-03 ENCOUNTER — Emergency Department (HOSPITAL_COMMUNITY)
Admission: EM | Admit: 2015-07-03 | Discharge: 2015-07-03 | Disposition: A | Discharge status: Home / Self Care | Payer: Self-pay | Attending: Emergency Medicine | Admitting: Emergency Medicine

## 2015-07-03 DIAGNOSIS — R112 Nausea with vomiting, unspecified: Secondary | ICD-10-CM

## 2015-07-03 LAB — I-STAT BETA HCG BLOOD, ED (MC, WL, AP ONLY): I-stat hCG, quantitative: <5 m[IU]/mL (ref <5)

## 2015-07-03 MED ORDER — SODIUM CHLORIDE 0.9 % IV BOLUS (SEPSIS)
500.0000 mL | Freq: Once | INTRAVENOUS | Status: AC
Start: 2015-07-03 — End: 2015-07-03
  Administered 2015-07-03: 500 mL via INTRAVENOUS

## 2015-07-03 MED ORDER — PROCHLORPERAZINE EDISYLATE 5 MG/ML IJ SOLN
10.0000 mg | Freq: Once | INTRAMUSCULAR | Status: AC
  Administered 2015-07-03: 10 mg via INTRAVENOUS
  Filled 2015-07-03: qty 2

## 2015-07-03 MED ORDER — DIPHENHYDRAMINE HCL 50 MG/ML IJ SOLN
25.0000 mg | Freq: Once | INTRAMUSCULAR | Status: AC
  Administered 2015-07-03: 25 mg via INTRAVENOUS
  Filled 2015-07-03: qty 1

## 2015-07-03 MED ORDER — ONDANSETRON 4 MG PO TBDP: 4.0000 mg | ORAL_TABLET | Freq: Three times a day (TID) | ORAL | Status: DC | PRN

## 2015-07-03 MED ORDER — ONDANSETRON HCL 4 MG/2ML IJ SOLN
4.0000 mg | Freq: Once | INTRAMUSCULAR | Status: AC
  Administered 2015-07-03: 4 mg via INTRAVENOUS
  Filled 2015-07-03: qty 2

## 2015-07-03 NOTE — ED Provider Notes (Signed)
CSN: [id number]     Arrival date & time 07/02/15  2103 History  By signing my name below, I, [name], attest that this documentation has been prepared under the direction and in the presence of Deno Etienne, DO. Electronically Signed: [name], ED Scribe. 07/03/2015. 12:52 AM   Chief Complaint  Patient presents with  . Emesis   The history is provided by the patient. No language interpreter was used.   Tara Estes is a 25 y.o. female with history of DM  who presents to the Emergency Department complaining of nausea and emesis with onset 2-3 days ago. The pt has been unable to hold down any food at home.  Associated symptoms include sweating, chills, myalgias, and intermittent abdominal "spasm". Pt denies diarrhea, cough, and congestion. She was exposed to an aunt with similar symptoms. Pt denies recent travel and suspicious food or water exposure.   Past Medical History  Diagnosis Date  . Diabetes mellitus   . Arthritis   . Bipolar 1 disorder ([place])    History reviewed. No pertinent past surgical history. History reviewed. No pertinent family history. Social History  Substance Use Topics  . Smoking status: Current Every Day Smoker    Types: Cigarettes  . Smokeless tobacco: None  . Alcohol Use: Yes   OB History    No data available     Review of Systems  Constitutional: Positive for chills and diaphoresis. Negative for fever.  HENT: Negative for congestion and rhinorrhea.   Eyes: Negative for redness and visual disturbance.  Respiratory: Negative for cough, shortness of breath and wheezing.   Cardiovascular: Negative for chest pain and palpitations.  Gastrointestinal: Positive for nausea, vomiting and abdominal pain. Negative for diarrhea.  Genitourinary: Negative for dysuria and urgency.  Musculoskeletal: Positive for myalgias. Negative for arthralgias.  Skin: Negative for pallor and wound.  Neurological: Negative for dizziness and headaches.    Allergies   Other  Home Medications   Prior to Admission medications   Medication Sig Start Date End Date Taking? Authorizing Provider  diclofenac (VOLTAREN) 75 MG EC tablet Take 75 mg by mouth 2 (two) times daily as needed for mild pain.  06/01/15  Yes Historical Provider, MD  methocarbamol (ROBAXIN) 500 MG tablet Take 500 mg by mouth every 6 (six) hours as needed for muscle spasms.   Yes Historical Provider, MD  ondansetron (ZOFRAN ODT) 4 MG disintegrating tablet Take 1 tablet (4 mg total) by mouth every 8 (eight) hours as needed for nausea or vomiting. 07/03/15   Deno Etienne, DO   BP 146/93 mmHg  Pulse 107  Temp(Src) 98.8 F (37.1 C) (Oral)  Resp 18  Ht 5\' 7"  (1.702 m)  Wt 280 lb (127.007 kg)  BMI 43.84 kg/m2  SpO2 100%  LMP 06/18/2015 (Exact Date) Physical Exam  Constitutional: She is oriented to person, place, and time. She appears well-developed and well-nourished. No distress.  HENT:  Head: Normocephalic and atraumatic.  Mucous membranes appear moist   Eyes: EOM are normal. Pupils are equal, round, and reactive to light.  Neck: Normal range of motion. Neck supple.  Cardiovascular: Normal rate and regular rhythm.  Exam reveals no gallop and no friction rub.   No murmur heard. Pulmonary/Chest: Effort normal. She has no wheezes. She has no rales.  Abdominal: Soft. She exhibits no distension. There is tenderness.  Mild epigastric tenderness  Musculoskeletal: She exhibits no edema or tenderness.  Neurological: She is alert and oriented to person, place, and time.  Skin:  Skin is warm and dry. She is not diaphoretic.  Psychiatric: She has a normal mood and affect. Her behavior is normal.  Nursing note and vitals reviewed.   ED Course  Procedures (including critical care time) DIAGNOSTIC STUDIES: Oxygen Saturation is 100% on RA,  normal by my interpretation.    COORDINATION OF CARE: 12:39 AM Discussed treatment plan which includes lab work, antiemetic medication, and IVF with pt at  bedside and pt agreed to plan.  Labs Review Labs Reviewed  COMPREHENSIVE METABOLIC PANEL - Abnormal; Notable for the following:    Glucose, Bld 109 (*)    Albumin 3.3 (*)    Total Bilirubin 0.2 (*)    All other components within normal limits  CBC - Abnormal; Notable for the following:    Hemoglobin 10.2 (*)    HCT 34.1 (*)    MCV 75.3 (*)    MCH 22.5 (*)    MCHC 29.9 (*)    All other components within normal limits  URINALYSIS, ROUTINE W REFLEX MICROSCOPIC (NOT AT [facility]) - Abnormal; Notable for the following:    APPearance CLOUDY (*)    All other components within normal limits  LIPASE, BLOOD  I-STAT BETA HCG BLOOD, ED ([facility], [facility], [facility] ONLY)  I-STAT TROPOININ, ED  I-STAT BETA HCG BLOOD, ED ([facility], [facility], [facility] ONLY)    Imaging Review No results found. I have personally reviewed and evaluated these lab results as part of my medical decision-making.   EKG Interpretation None      MDM   Final diagnoses:  Non-intractable vomiting with nausea, vomiting of unspecified type    25 yo F with emesis, no noted abdominal pain.  Labs reassuring, able to tolerate po.  D/c home.   I personally performed the services described in this documentation, which was scribed in my presence. The recorded information has been reviewed and is accurate.   6:08 AM:  I have discussed the diagnosis/risks/treatment options with the patient and family and believe the pt to be eligible for discharge home to follow-up with PCP. We also discussed returning to the ED immediately if new or worsening sx occur. We discussed the sx which are most concerning (e.g., sudden worsening pain, fever, inability to tolerate by mouth) that necessitate immediate return. Medications administered to the patient during their visit and any new prescriptions provided to the patient are listed below.  Medications given during this visit Medications  ondansetron (ZOFRAN-ODT) 4 MG disintegrating tablet (not administered)  ondansetron  (ZOFRAN-ODT) disintegrating tablet 4 mg (4 mg Oral Given 07/02/15 2137)  sodium chloride 0.9 % bolus 500 mL (0 mLs Intravenous Stopped 07/03/15 0200)  ondansetron (ZOFRAN) injection 4 mg (4 mg Intravenous Given 07/03/15 0119)  prochlorperazine (COMPAZINE) injection 10 mg (10 mg Intravenous Given 07/03/15 0246)  diphenhydrAMINE (BENADRYL) injection 25 mg (25 mg Intravenous Given 07/03/15 0246)    Discharge Medication List as of 07/03/2015  1:27 AM      The patient appears reasonably screen and/or stabilized for discharge and I doubt any other medical condition or other [facility] requiring further screening, evaluation, or treatment in the ED at this time prior to discharge.     Deno Etienne, DO 07/03/15 [phone]

## 2015-07-03 NOTE — Discharge Instructions (Signed)

## 2015-07-03 NOTE — ED Notes (Addendum)
Pt vomited after sips of water and small bites of crackers. Dr. Tyrone Nine notified.

## 2016-07-17 ENCOUNTER — Emergency Department
Admission: EM | Admit: 2016-07-17 | Discharge: 2016-07-17 | Disposition: A | Discharge status: Home / Self Care | Payer: Self-pay | Attending: Emergency Medicine | Admitting: Emergency Medicine

## 2016-07-17 ENCOUNTER — Encounter: Payer: Self-pay | Admitting: Emergency Medicine

## 2016-07-17 DIAGNOSIS — E119 Type 2 diabetes mellitus without complications: Secondary | ICD-10-CM | POA: Insufficient documentation

## 2016-07-17 DIAGNOSIS — K529 Noninfective gastroenteritis and colitis, unspecified: Secondary | ICD-10-CM

## 2016-07-17 DIAGNOSIS — F17210 Nicotine dependence, cigarettes, uncomplicated: Secondary | ICD-10-CM | POA: Insufficient documentation

## 2016-07-17 LAB — LIPASE, BLOOD: Lipase: 26 U/L (ref 11–51)

## 2016-07-17 LAB — COMPREHENSIVE METABOLIC PANEL
ALBUMIN: 3.5 g/dL (ref 3.5–5.0)
ALK PHOS: 96 U/L (ref 38–126)
ALT: 16 U/L (ref 14–54)
AST: 20 U/L (ref 15–41)
Anion gap: 5 (ref 5–15)
BILIRUBIN TOTAL: 0.4 mg/dL (ref 0.3–1.2)
BUN: 9 mg/dL (ref 6–20)
CALCIUM: 8.9 mg/dL (ref 8.9–10.3)
CO2: 26 mmol/L (ref 22–32)
Chloride: 106 mmol/L (ref 101–111)
Creatinine, Ser: 0.7 mg/dL (ref 0.44–1.00)
GFR calc Af Amer: >60 mL/min (ref >60)
GFR calc non Af Amer: >60 mL/min (ref >60)
GLUCOSE: 163 mg/dL — AB (ref 65–99)
Potassium: 3.1 mmol/L — ABNORMAL LOW (ref 3.5–5.1)
Sodium: 137 mmol/L (ref 135–145)
TOTAL PROTEIN: 7.6 g/dL (ref 6.5–8.1)

## 2016-07-17 LAB — CBC
HEMATOCRIT: 35.1 % (ref 35.0–47.0)
Hemoglobin: 11.4 g/dL — ABNORMAL LOW (ref 12.0–16.0)
MCH: 24 pg — ABNORMAL LOW (ref 26.0–34.0)
MCHC: 32.3 g/dL (ref 32.0–36.0)
MCV: 74.4 fL — ABNORMAL LOW (ref 80.0–100.0)
Platelets: 318 10*3/uL (ref 150–440)
RBC: 4.73 MIL/uL (ref 3.80–5.20)
RDW: 15.5 % — AB (ref 11.5–14.5)
WBC: 8.3 10*3/uL (ref 3.6–11.0)

## 2016-07-17 MED ORDER — POTASSIUM CHLORIDE CRYS ER 20 MEQ PO TBCR
40.0000 meq | EXTENDED_RELEASE_TABLET | Freq: Once | ORAL | Status: AC
  Administered 2016-07-17: 40 meq via ORAL
  Filled 2016-07-17: qty 2

## 2016-07-17 MED ORDER — LOPERAMIDE HCL 2 MG PO CAPS
4.0000 mg | ORAL_CAPSULE | Freq: Once | ORAL | Status: AC
  Administered 2016-07-17: 4 mg via ORAL
  Filled 2016-07-17: qty 2

## 2016-07-17 MED ORDER — ONDANSETRON 4 MG PO TBDP: 4.0000 mg | ORAL_TABLET | Freq: Three times a day (TID) | ORAL | 0 refills | Status: DC | PRN

## 2016-07-17 MED ORDER — ONDANSETRON 4 MG PO TBDP
4.0000 mg | ORAL_TABLET | Freq: Once | ORAL | Status: AC
  Administered 2016-07-17: 4 mg via ORAL
  Filled 2016-07-17: qty 1

## 2016-07-17 MED ORDER — DICYCLOMINE HCL 20 MG PO TABS: 20.0000 mg | ORAL_TABLET | Freq: Three times a day (TID) | ORAL | 0 refills | Status: DC | PRN

## 2016-07-17 NOTE — ED Provider Notes (Signed)
[facility] Emergency Department Provider Note       Time seen: ----------------------------------------- 6:08 PM on 07/17/2016 -----------------------------------------     I have reviewed the triage vital signs and the nursing notes.   HISTORY   Chief Complaint Diarrhea and Headache    HPI Tara Estes is a 26 y.o. female who presents to the ED for diarrhea and headache for the last day. Patient's had intermittent abdominal cramping with nausea. Patient states a friend had similar symptoms recently. She denies fevers, chills, chest pain, shortness of breath or other symptoms at this time.   Past Medical History:  Diagnosis Date  . Arthritis   . Bipolar 1 disorder ([place])   . Diabetes mellitus     There are no active problems to display for this patient.   No past surgical history on file.  Allergies Other  Social History Social History  Substance Use Topics  . Smoking status: Current Every Day Smoker    Types: Cigarettes  . Smokeless tobacco: Never Used  . Alcohol use Yes    Review of Systems Constitutional: Negative for fever. Eyes: Negative for vision changes ENT:  Negative for congestion, sore throat Cardiovascular: Negative for chest pain. Respiratory: Negative for shortness of breath. Gastrointestinal: Positive for abdominal cramping, nausea, diarrhea Genitourinary: Negative for dysuria. Musculoskeletal: Negative for back pain. Skin: Negative for rash. Neurological: Positive for headache  All systems negative/normal/unremarkable except as stated in the HPI  ____________________________________________   PHYSICAL EXAM:  VITAL SIGNS: ED Triage Vitals  Enc Vitals Group     BP 07/17/16 1636 (!) 145/79     Pulse Rate 07/17/16 1636 92     Resp 07/17/16 1636 16     Temp 07/17/16 1636 98.7 F (37.1 C)     Temp Source 07/17/16 1636 Oral     SpO2 07/17/16 1636 99 %     Weight 07/17/16 1633 280 lb (127 kg)     Height  07/17/16 1633 5\' 7"  (1.702 m)     Head Circumference --      Peak Flow --      Pain Score 07/17/16 1633 8     Pain Loc --      Pain Edu? --      Excl. in [place]? --    Constitutional: Alert and oriented. Well appearing and in no distress. Eyes: Conjunctivae are normal. Normal extraocular movements. ENT   Head: Normocephalic and atraumatic.   Nose: No congestion/rhinnorhea.   Mouth/Throat: Mucous membranes are moist.   Neck: No stridor. Cardiovascular: Normal rate, regular rhythm. No murmurs, rubs, or gallops. Respiratory: Normal respiratory effort without tachypnea nor retractions. Breath sounds are clear and equal bilaterally. No wheezes/rales/rhonchi. Gastrointestinal: Soft and nontender. Normal bowel sounds Musculoskeletal: Nontender with normal range of motion in extremities. No lower extremity tenderness nor edema. Neurologic:  Normal speech and language. No gross focal neurologic deficits are appreciated.  Skin:  Skin is warm, dry and intact. No rash noted. Psychiatric: Mood and affect are normal. Speech and behavior are normal.  ____________________________________________  ED COURSE:  Pertinent labs & imaging results that were available during my care of the patient were reviewed by me and considered in my medical decision making (see chart for details). Patient presents for symptoms of GI upset, we will assess with labs and imaging as indicated.   Procedures ____________________________________________   LABS (pertinent positives/negatives)  Labs Reviewed  COMPREHENSIVE METABOLIC PANEL - Abnormal; Notable for the following:  Result Value   Potassium 3.1 (*)    Glucose, Bld 163 (*)    All other components within normal limits  CBC - Abnormal; Notable for the following:    Hemoglobin 11.4 (*)    MCV 74.4 (*)    MCH 24.0 (*)    RDW 15.5 (*)    All other components within normal limits  LIPASE, BLOOD  URINALYSIS, COMPLETE (UACMP) WITH MICROSCOPIC  POC  URINE PREG, ED   ____________________________________________  FINAL ASSESSMENT AND PLAN  Gastroenteritis  Plan: Patient's labs were dictated above. Patient had presented for symptoms of gastritis. She'll be discharged with antiemetics and is encouraged to advance diet as tolerated, return for worsening symptoms.   Earleen Newport, MD   Note: This note was generated in part or whole with voice recognition software. Voice recognition is usually quite accurate but there are transcription errors that can and very often do occur. I apologize for any typographical errors that were not detected and corrected.     Earleen Newport, MD 07/17/16 [phone]

## 2016-07-17 NOTE — ED Triage Notes (Signed)
C/O diarrhea and headache x 1 day.  Intermittent abdominal cramping.  Also c/o nausea.

## 2017-01-14 ENCOUNTER — Encounter: Payer: Self-pay | Admitting: Intensive Care

## 2017-01-14 ENCOUNTER — Other Ambulatory Visit: Payer: Self-pay

## 2017-01-14 ENCOUNTER — Emergency Department
Admission: EM | Admit: 2017-01-14 | Discharge: 2017-01-14 | Disposition: A | Discharge status: Home / Self Care | Payer: Self-pay | Attending: Emergency Medicine | Admitting: Emergency Medicine

## 2017-01-14 DIAGNOSIS — R2231 Localized swelling, mass and lump, right upper limb: Secondary | ICD-10-CM | POA: Insufficient documentation

## 2017-01-14 DIAGNOSIS — F17210 Nicotine dependence, cigarettes, uncomplicated: Secondary | ICD-10-CM | POA: Insufficient documentation

## 2017-01-14 DIAGNOSIS — E119 Type 2 diabetes mellitus without complications: Secondary | ICD-10-CM | POA: Insufficient documentation

## 2017-01-14 DIAGNOSIS — Y929 Unspecified place or not applicable: Secondary | ICD-10-CM | POA: Insufficient documentation

## 2017-01-14 DIAGNOSIS — M79641 Pain in right hand: Secondary | ICD-10-CM | POA: Insufficient documentation

## 2017-01-14 DIAGNOSIS — Y998 Other external cause status: Secondary | ICD-10-CM | POA: Insufficient documentation

## 2017-01-14 DIAGNOSIS — W57XXXA Bitten or stung by nonvenomous insect and other nonvenomous arthropods, initial encounter: Secondary | ICD-10-CM | POA: Insufficient documentation

## 2017-01-14 DIAGNOSIS — Y9389 Activity, other specified: Secondary | ICD-10-CM | POA: Insufficient documentation

## 2017-01-14 MED ORDER — METHYLPREDNISOLONE SODIUM SUCC 125 MG IJ SOLR
125.0000 mg | Freq: Once | INTRAMUSCULAR | Status: AC
  Administered 2017-01-14: 125 mg via INTRAMUSCULAR
  Filled 2017-01-14: qty 2

## 2017-01-14 MED ORDER — PREDNISONE 10 MG PO TABS: ORAL_TABLET | ORAL | 0 refills | Status: DC

## 2017-01-14 MED ORDER — FAMOTIDINE 20 MG PO TABS
20.0000 mg | ORAL_TABLET | Freq: Once | ORAL | Status: AC
  Administered 2017-01-14: 20 mg via ORAL
  Filled 2017-01-14: qty 1

## 2017-01-14 MED ORDER — DIPHENHYDRAMINE HCL 25 MG PO CAPS: 25.0000 mg | ORAL_CAPSULE | ORAL | 0 refills | Status: DC | PRN

## 2017-01-14 MED ORDER — DIPHENHYDRAMINE HCL 50 MG/ML IJ SOLN
25.0000 mg | Freq: Once | INTRAMUSCULAR | Status: AC
  Administered 2017-01-14: 25 mg via INTRAMUSCULAR
  Filled 2017-01-14: qty 1

## 2017-01-14 NOTE — ED Provider Notes (Signed)
[facility] Emergency Department Provider Note  ____________________________________________  Time seen: Approximately 8:11 AM  I have reviewed the triage vital signs and the nursing notes.   HISTORY  Chief Complaint Hand Pain (swelling L hand)    HPI Tara Estes is a 26 y.o. female that presents to the emergency department for evaluation of right hand swelling for 2 days.  Patient states that she woke up on Friday morning with some tenderness around her right fifth knuckle.  Swelling has not improved in the last 2 days.  She is allergic to chili powder.  She does not recall being bitten by anything.  She checked her bed for spiders but did not see any.  No fever, chills, shortness of breath, CP.  Past Medical History:  Diagnosis Date  . Arthritis   . Bipolar 1 disorder ([place])   . Diabetes mellitus     There are no active problems to display for this patient.   History reviewed. No pertinent surgical history.  Prior to Admission medications   Medication Sig Start Date End Date Taking? Authorizing Provider  diclofenac (VOLTAREN) 75 MG EC tablet Take 75 mg by mouth 2 (two) times daily as needed for mild pain.  06/01/15   [provider]  dicyclomine (BENTYL) 20 MG tablet Take 1 tablet (20 mg total) by mouth 3 (three) times daily as needed for spasms. 07/17/16   Earleen Newport, MD  diphenhydrAMINE (BENADRYL) 25 mg capsule Take 1 capsule (25 mg total) by mouth every 4 (four) hours as needed. 01/14/17 01/14/18  Laban Emperor, PA-C  methocarbamol (ROBAXIN) 500 MG tablet Take 500 mg by mouth every 6 (six) hours as needed for muscle spasms.    [provider]  ondansetron (ZOFRAN ODT) 4 MG disintegrating tablet Take 1 tablet (4 mg total) by mouth every 8 (eight) hours as needed for nausea or vomiting. 07/03/15   Deno Etienne, DO  ondansetron (ZOFRAN ODT) 4 MG disintegrating tablet Take 1 tablet (4 mg total) by mouth every 8 (eight) hours as  needed for nausea or vomiting. 07/17/16   Earleen Newport, MD  predniSONE (DELTASONE) 10 MG tablet Take 6 tablets on day 1, take 5 tablets on day 2, take 4 tablets on day 3, take 3 tablets on day 4, take 2 tablets on day 5, take 1 tablet on day 6 01/14/17   Laban Emperor, PA-C    Allergies Other  History reviewed. No pertinent family history.  Social History Social History   Tobacco Use  . Smoking status: Current Every Day Smoker    Types: Cigarettes  . Smokeless tobacco: Never Used  Substance Use Topics  . Alcohol use: Yes  . Drug use: No     Review of Systems  Constitutional: No fever/chills Cardiovascular: No chest pain. Respiratory:  No SOB. Musculoskeletal: Positive for hand pain. Skin: Negative for abrasions, lacerations, ecchymosis.  Positive for rash.    ____________________________________________   PHYSICAL EXAM:  VITAL SIGNS: ED Triage Vitals  Enc Vitals Group     BP 01/14/17 0727 (!) 162/97     Pulse Rate 01/14/17 0727 90     Resp 01/14/17 0727 14     Temp 01/14/17 0727 98.7 F (37.1 C)     Temp Source 01/14/17 0727 Oral     SpO2 01/14/17 0727 100 %     Weight 01/14/17 0720 280 lb (127 kg)     Height 01/14/17 0720 5\' 7"  (1.702 m)     Head  Circumference --      Peak Flow --      Pain Score 01/14/17 0720 7     Pain Loc --      Pain Edu? --      Excl. in [place]? --      Constitutional: Alert and oriented. Well appearing and in no acute distress. Eyes: Conjunctivae are normal. PERRL. EOMI. Head: Atraumatic. ENT:      Ears:      Nose: No congestion/rhinnorhea.      Mouth/Throat: Mucous membranes are moist.  Neck: No stridor. Cardiovascular: Normal rate, regular rhythm.  Good peripheral circulation.  Symmetric radial pulses bilaterally. Respiratory: Normal respiratory effort without tachypnea or retractions. Lungs CTAB. Good air entry to the bases with no decreased or absent breath sounds. Musculoskeletal: Full range of motion to all  extremities. No gross deformities appreciated. Neurologic:  Normal speech and language. No gross focal neurologic deficits are appreciated.  Skin:  Skin is warm, dry and intact.  2 white/pink 1 millimeter papules to right fifth knuckle.  Mild swelling of ulnar side of right hand.  No tenderness to palpation.   ____________________________________________   LABS (all labs ordered are listed, but only abnormal results are displayed)  Labs Reviewed - No data to display ____________________________________________  EKG   ____________________________________________  RADIOLOGY  No results found.  ____________________________________________    PROCEDURES  Procedure(s) performed:    Procedures    Medications  methylPREDNISolone sodium succinate (SOLU-MEDROL) 125 mg/2 mL injection 125 mg (125 mg Intramuscular Given 01/14/17 0816)  diphenhydrAMINE (BENADRYL) injection 25 mg (25 mg Intramuscular Given 01/14/17 0815)  famotidine (PEPCID) tablet 20 mg (20 mg Oral Given 01/14/17 0815)     ____________________________________________   INITIAL IMPRESSION / ASSESSMENT AND PLAN / ED COURSE  Pertinent labs & imaging results that were available during my care of the patient were reviewed by me and considered in my medical decision making (see chart for details).  Review of the [place] CSRS was performed in accordance of the [place] prior to dispensing any controlled drugs.   Patient's diagnosis is consistent with insect bite.  Vital signs and exam are reassuring.  Hand appears that patient was bitten by an insect.  Patient was given IM Solu-Medrol, IM Benadryl and Pepcid.  Patient will be discharged home with prescriptions for prednisone, Benadryl. Patient is to follow up with PCP as directed. Patient is given ED precautions to return to the ED for any worsening or new symptoms.     ____________________________________________  FINAL CLINICAL IMPRESSION(S) / ED DIAGNOSES  Final  diagnoses:  Insect bite, initial encounter      NEW MEDICATIONS STARTED DURING THIS VISIT:  ED Discharge Orders        Ordered    predniSONE (DELTASONE) 10 MG tablet     01/14/17 0900    diphenhydrAMINE (BENADRYL) 25 mg capsule  Every 4 hours PRN     01/14/17 0900          This chart was dictated using voice recognition software/Dragon. Despite best efforts to proofread, errors can occur which can change the meaning. Any change was purely unintentional.    Laban Emperor, PA-C 01/14/17 1025    Harvest Dark, MD 01/14/17 [phone]

## 2017-01-14 NOTE — ED Notes (Signed)
Woke up two days ago with two small circles on left hand just above fifth digit. Reports hand got yesterday above 5th digit.

## 2017-01-14 NOTE — ED Notes (Signed)

## 2017-01-14 NOTE — ED Triage Notes (Signed)
Patient has swelling and pain to L hand. No known injury

## 2017-01-14 NOTE — ED Notes (Signed)
Palpated hard area about 60mm diameter just above fifth digit in wrist.

## 2017-03-28 ENCOUNTER — Other Ambulatory Visit: Payer: Self-pay

## 2017-03-28 ENCOUNTER — Encounter (HOSPITAL_BASED_OUTPATIENT_CLINIC_OR_DEPARTMENT_OTHER): Payer: Self-pay | Admitting: Emergency Medicine

## 2017-03-28 ENCOUNTER — Emergency Department (HOSPITAL_BASED_OUTPATIENT_CLINIC_OR_DEPARTMENT_OTHER)
Admission: EM | Admit: 2017-03-28 | Discharge: 2017-03-28 | Disposition: A | Discharge status: Home / Self Care | Payer: Self-pay | Attending: Emergency Medicine | Admitting: Emergency Medicine

## 2017-03-28 DIAGNOSIS — T7840XA Allergy, unspecified, initial encounter: Secondary | ICD-10-CM | POA: Insufficient documentation

## 2017-03-28 DIAGNOSIS — E119 Type 2 diabetes mellitus without complications: Secondary | ICD-10-CM | POA: Insufficient documentation

## 2017-03-28 DIAGNOSIS — R21 Rash and other nonspecific skin eruption: Secondary | ICD-10-CM | POA: Insufficient documentation

## 2017-03-28 DIAGNOSIS — F17210 Nicotine dependence, cigarettes, uncomplicated: Secondary | ICD-10-CM | POA: Insufficient documentation

## 2017-03-28 MED ORDER — CEPHALEXIN 500 MG PO CAPS: 500.0000 mg | ORAL_CAPSULE | Freq: Three times a day (TID) | ORAL | 0 refills | Status: DC

## 2017-03-28 MED ORDER — PREDNISONE 10 MG PO TABS
60.0000 mg | ORAL_TABLET | Freq: Once | ORAL | Status: AC
  Administered 2017-03-28: 60 mg via ORAL
  Filled 2017-03-28: qty 1

## 2017-03-28 NOTE — Discharge Instructions (Signed)
Your skin reaction is likely a reaction.  Continue taking benadryl and hydrocortisone cream as previously used. If you notice no improvement in the next 2-3 days, then take keflex as prescribed for the full duration.  Return if you have any concerns.

## 2017-03-28 NOTE — ED Notes (Signed)
Pt verbalizes understanding of d/c instructions and denies any further needs at this time. 

## 2017-03-28 NOTE — ED Triage Notes (Signed)
States has several bumps that might be insect bites on left  forearm and rt upper arm ,  One on breast ,pt states they are itchiy

## 2017-03-28 NOTE — ED Provider Notes (Signed)
[facility] EMERGENCY DEPARTMENT Provider Note   CSN: [id number] Arrival date & time: 03/28/17  1423     History   Chief Complaint Chief Complaint  Patient presents with  . Rash    HPI Tara Estes is a 27 y.o. female.  HPI   27 year old female who is moderately obese, with history of diabetes, bipolar, arthritis presenting complaining of itchy rash.  Patient mentioned for the past 2 days she noticed itchy skin rash noted to her left forearm, right upper arm, anterior chest, and in her back.  Symptom is moderate in severity.  She tries using Benadryl, and topical hydrocortisone cream without relief.  She denies having fever, lightheadedness, dizziness, throat swelling, trouble swallowing, chest pain, wheezing, shortness of breath, or abdominal cramping.  She did not recall any specific changes in her environment.  She did buy a new mattress several days prior.  She has had reaction in the past.  Past Medical History:  Diagnosis Date  . Arthritis   . Bipolar 1 disorder ([place])   . Diabetes mellitus     There are no active problems to display for this patient.   History reviewed. No pertinent surgical history.  OB History    No data available       Home Medications    Prior to Admission medications   Medication Sig Start Date End Date Taking? Authorizing Provider  diclofenac (VOLTAREN) 75 MG EC tablet Take 75 mg by mouth 2 (two) times daily as needed for mild pain.  06/01/15   [provider]  dicyclomine (BENTYL) 20 MG tablet Take 1 tablet (20 mg total) by mouth 3 (three) times daily as needed for spasms. 07/17/16   Earleen Newport, MD  diphenhydrAMINE (BENADRYL) 25 mg capsule Take 1 capsule (25 mg total) by mouth every 4 (four) hours as needed. 01/14/17 01/14/18  Laban Emperor, PA-C  methocarbamol (ROBAXIN) 500 MG tablet Take 500 mg by mouth every 6 (six) hours as needed for muscle spasms.    [provider]  ondansetron (ZOFRAN ODT) 4  MG disintegrating tablet Take 1 tablet (4 mg total) by mouth every 8 (eight) hours as needed for nausea or vomiting. 07/03/15   Deno Etienne, DO  ondansetron (ZOFRAN ODT) 4 MG disintegrating tablet Take 1 tablet (4 mg total) by mouth every 8 (eight) hours as needed for nausea or vomiting. 07/17/16   Earleen Newport, MD  predniSONE (DELTASONE) 10 MG tablet Take 6 tablets on day 1, take 5 tablets on day 2, take 4 tablets on day 3, take 3 tablets on day 4, take 2 tablets on day 5, take 1 tablet on day 6 01/14/17   Laban Emperor, PA-C    Family History No family history on file.  Social History Social History   Tobacco Use  . Smoking status: Current Every Day Smoker    Types: Cigarettes  . Smokeless tobacco: Never Used  Substance Use Topics  . Alcohol use: Yes  . Drug use: No     Allergies   Other   Review of Systems Review of Systems  All other systems reviewed and are negative.    Physical Exam Updated Vital Signs BP (!) 175/103 (BP Location: Right Arm)   Pulse 93   Temp 98.6 F (37 C) (Oral)   Resp 16   Ht 5\' 7"  (1.702 m)   Wt 133.4 kg (294 lb)   SpO2 100%   BMI 46.05 kg/m   Physical Exam  Constitutional: She  is oriented to person, place, and time. She appears well-developed and well-nourished. No distress.  HENT:  Head: Atraumatic.  Mouth/Throat: Oropharynx is clear and moist.  Eyes: Conjunctivae are normal.  Neck: Neck supple.  Cardiovascular: Normal rate and regular rhythm.  Pulmonary/Chest: Effort normal and breath sounds normal.  Neurological: She is alert and oriented to person, place, and time.  Skin: No rash (Mild patchy faint erythematous skin changes noted to right upper arm, left forearm, anterior chest, and lower back.  It is very faint and no signs of infection noted.) noted.  Psychiatric: She has a normal mood and affect.  Nursing note and vitals reviewed.    ED Treatments / Results  Labs (all labs ordered are listed, but only abnormal  results are displayed) Labs Reviewed - No data to display  EKG  EKG Interpretation None       Radiology No results found.  Procedures Procedures (including critical care time)  Medications Ordered in ED Medications  predniSONE (DELTASONE) tablet 60 mg (60 mg Oral Given 03/28/17 1741)     Initial Impression / Assessment and Plan / ED Course  I have reviewed the triage vital signs and the nursing notes.  Pertinent labs & imaging results that were available during my care of the patient were reviewed by me and considered in my medical decision making (see chart for details).     BP (!) 145/81   Pulse 75   Temp 98.6 F (37 C) (Oral)   Resp 18   Ht 5\' 7"  (1.702 m)   Wt 133.4 kg (294 lb)   SpO2 100%   BMI 46.05 kg/m    Final Clinical Impressions(s) / ED Diagnoses   Final diagnoses:  Allergic reaction, initial encounter    ED Discharge Orders        Ordered    cephALEXin (KEFLEX) 500 MG capsule  3 times daily     03/28/17 1744     5:47 PM Patient here with localized skin irritation.  It is likely an allergic reaction of some sort.  Unable to identify the source she did change her mattress several days prior.  It does not appears to be cellulitis and there is no systemic manifestation.  No evidence of anaphylactic reaction.  Due to patient's concern, I will prescribe a prescription of Keflex to use if condition worsen to cover for potential cellulitis.  Return precautions discussed.   Domenic Moras, PA-C 03/28/17 1748    Julianne Rice, MD 03/31/17 1322

## 2017-11-01 ENCOUNTER — Emergency Department: Payer: Self-pay

## 2017-11-01 ENCOUNTER — Encounter: Payer: Self-pay | Admitting: *Deleted

## 2017-11-01 ENCOUNTER — Emergency Department
Admission: EM | Admit: 2017-11-01 | Discharge: 2017-11-01 | Disposition: A | Discharge status: Home / Self Care | Payer: Self-pay | Attending: Emergency Medicine | Admitting: Emergency Medicine

## 2017-11-01 ENCOUNTER — Other Ambulatory Visit: Payer: Self-pay

## 2017-11-01 DIAGNOSIS — R079 Chest pain, unspecified: Secondary | ICD-10-CM

## 2017-11-01 DIAGNOSIS — F319 Bipolar disorder, unspecified: Secondary | ICD-10-CM | POA: Insufficient documentation

## 2017-11-01 DIAGNOSIS — F17210 Nicotine dependence, cigarettes, uncomplicated: Secondary | ICD-10-CM | POA: Insufficient documentation

## 2017-11-01 DIAGNOSIS — E119 Type 2 diabetes mellitus without complications: Secondary | ICD-10-CM | POA: Insufficient documentation

## 2017-11-01 LAB — CBC
HCT: 32.6 % — ABNORMAL LOW (ref 36.0–46.0)
HEMOGLOBIN: 9.8 g/dL — AB (ref 12.0–15.0)
MCH: 23.1 pg — ABNORMAL LOW (ref 26.0–34.0)
MCHC: 30.1 g/dL (ref 30.0–36.0)
MCV: 76.9 fL — ABNORMAL LOW (ref 80.0–100.0)
Platelets: 408 10*3/uL — ABNORMAL HIGH (ref 150–400)
RBC: 4.24 MIL/uL (ref 3.87–5.11)
RDW: 14.7 % (ref 11.5–15.5)
WBC: 10.1 10*3/uL (ref 4.0–10.5)
nRBC: 0 % (ref 0.0–0.2)

## 2017-11-01 LAB — BASIC METABOLIC PANEL
Anion gap: 7 (ref 5–15)
BUN: 9 mg/dL (ref 6–20)
CO2: 26 mmol/L (ref 22–32)
Calcium: 8.9 mg/dL (ref 8.9–10.3)
Chloride: 106 mmol/L (ref 98–111)
Creatinine, Ser: 0.77 mg/dL (ref 0.44–1.00)
Glucose, Bld: 130 mg/dL — ABNORMAL HIGH (ref 70–99)
POTASSIUM: 3.3 mmol/L — AB (ref 3.5–5.1)
SODIUM: 139 mmol/L (ref 135–145)

## 2017-11-01 LAB — FIBRIN DERIVATIVES D-DIMER (ARMC ONLY): FIBRIN DERIVATIVES D-DIMER (ARMC): 1816.41 ng{FEU}/mL — AB (ref 0.00–499.00)

## 2017-11-01 LAB — TROPONIN I

## 2017-11-01 MED ORDER — IOPAMIDOL (ISOVUE-370) INJECTION 76%
75.0000 mL | Freq: Once | INTRAVENOUS | Status: AC | PRN
  Administered 2017-11-01: 100 mL via INTRAVENOUS

## 2017-11-01 MED ORDER — SODIUM CHLORIDE 0.9 % IV BOLUS
1000.0000 mL | Freq: Once | INTRAVENOUS | Status: AC
  Administered 2017-11-01: 1000 mL via INTRAVENOUS

## 2017-11-01 NOTE — ED Triage Notes (Signed)
Pt ambulatory to triage.  Pt has right side chest pain for 2 days.  Pt also reports hot and cold flashes.  Intermittent sob.  cig smoker.  Pt alert  Speech clear.

## 2017-11-01 NOTE — ED Provider Notes (Signed)
[facility] Emergency Department Provider Note  ___________________________________________   First MD Initiated Contact with Patient 11/01/17 1810     (approximate)  I have reviewed the triage vital signs and the nursing notes.   HISTORY  Chief Complaint No chief complaint on file.   HPI Tara Estes is a 27 y.o. female with a history of bipolar disease as well as diabetes and hypertension was presented to the emergency department with right-sided chest pain over the past 2 days.  Patient says the pain is been constant and intermittently radiating to the center of her chest.  Says the pain worsens with deep breathing.  Denies being on any hormone supplementation.  Says the pain is an 8 out of 10 at this time.  Does not worsen with movement or exertion.  Says that she has had intermittent shortness of breath.  Says that she smokes cigarettes.  Says that her father had heart failure in his 38s.  She is unsure of what created this such as if he had high blood pressure that was uncontrolled.   Past Medical History:  Diagnosis Date  . Arthritis   . Bipolar 1 disorder ([place])   . Diabetes mellitus     There are no active problems to display for this patient.   No past surgical history on file.  Prior to Admission medications   Medication Sig Start Date End Date Taking? Authorizing Provider  cephALEXin (KEFLEX) 500 MG capsule Take 1 capsule (500 mg total) by mouth 3 (three) times daily. 03/28/17   Domenic Moras, PA-C  diclofenac (VOLTAREN) 75 MG EC tablet Take 75 mg by mouth 2 (two) times daily as needed for mild pain.  06/01/15   [provider]  dicyclomine (BENTYL) 20 MG tablet Take 1 tablet (20 mg total) by mouth 3 (three) times daily as needed for spasms. 07/17/16   Earleen Newport, MD  diphenhydrAMINE (BENADRYL) 25 mg capsule Take 1 capsule (25 mg total) by mouth every 4 (four) hours as needed. 01/14/17 01/14/18  Laban Emperor, PA-C    methocarbamol (ROBAXIN) 500 MG tablet Take 500 mg by mouth every 6 (six) hours as needed for muscle spasms.    [provider]  ondansetron (ZOFRAN ODT) 4 MG disintegrating tablet Take 1 tablet (4 mg total) by mouth every 8 (eight) hours as needed for nausea or vomiting. 07/03/15   Deno Etienne, DO  ondansetron (ZOFRAN ODT) 4 MG disintegrating tablet Take 1 tablet (4 mg total) by mouth every 8 (eight) hours as needed for nausea or vomiting. 07/17/16   Earleen Newport, MD  predniSONE (DELTASONE) 10 MG tablet Take 6 tablets on day 1, take 5 tablets on day 2, take 4 tablets on day 3, take 3 tablets on day 4, take 2 tablets on day 5, take 1 tablet on day 6 01/14/17   Laban Emperor, PA-C    Allergies Other  No family history on file.  Social History Social History   Tobacco Use  . Smoking status: Current Every Day Smoker    Types: Cigarettes  . Smokeless tobacco: Never Used  Substance Use Topics  . Alcohol use: Yes  . Drug use: No    Review of Systems  Constitutional: No fever/chills Eyes: No visual changes. ENT: No sore throat. Cardiovascular: As above Respiratory: As above Gastrointestinal: No abdominal pain.  No nausea, no vomiting.  No diarrhea.  No constipation. Genitourinary: Negative for dysuria. Musculoskeletal: Negative for back pain. Skin: Negative for rash. Neurological:  Negative for headaches, focal weakness or numbness.   ____________________________________________   PHYSICAL EXAM:  VITAL SIGNS: ED Triage Vitals  Enc Vitals Group     BP 11/01/17 1639 (!) 150/86     Pulse Rate 11/01/17 1639 (!) 105     Resp 11/01/17 1639 18     Temp 11/01/17 1639 99.3 F (37.4 C)     Temp Source 11/01/17 1639 Oral     SpO2 11/01/17 1639 99 %     Weight 11/01/17 1632 (!) 304 lb (137.9 kg)     Height 11/01/17 1632 5\' 8"  (1.727 m)     Head Circumference --      Peak Flow --      Pain Score 11/01/17 1632 9     Pain Loc --      Pain Edu? --      Excl. in [place]?  --     Constitutional: Alert and oriented. Well appearing and in no acute distress. Eyes: Conjunctivae are normal.  Head: Atraumatic. Nose: No congestion/rhinnorhea. Mouth/Throat: Mucous membranes are moist.  Neck: No stridor.   Cardiovascular: Normal rate, regular rhythm. Grossly normal heart sounds.  Tenderness was reproducible to palpation of the right side of the chest. Respiratory: Normal respiratory effort.  No retractions. Lungs CTAB. Gastrointestinal: Soft and nontender. No distention.  Musculoskeletal: No lower extremity tenderness nor edema.  No joint effusions. Neurologic:  Normal speech and language. No gross focal neurologic deficits are appreciated. Skin:  Skin is warm, dry and intact. No rash noted. Psychiatric: Mood and affect are normal. Speech and behavior are normal.  ____________________________________________   LABS (all labs ordered are listed, but only abnormal results are displayed)  Labs Reviewed  BASIC METABOLIC PANEL - Abnormal; Notable for the following components:      Result Value   Potassium 3.3 (*)    Glucose, Bld 130 (*)    All other components within normal limits  CBC - Abnormal; Notable for the following components:   Hemoglobin 9.8 (*)    HCT 32.6 (*)    MCV 76.9 (*)    MCH 23.1 (*)    Platelets 408 (*)    All other components within normal limits  FIBRIN DERIVATIVES D-DIMER ([facility] ONLY) - Abnormal; Notable for the following components:   Fibrin derivatives D-dimer [facility]) 3,016.01 (*)    All other components within normal limits  TROPONIN I   ____________________________________________  EKG  ED ECG REPORT I, Doran Stabler, the attending physician, personally viewed and interpreted this ECG.   Date: 11/01/2017  EKG Time: 1638  Rate: 105  Rhythm: sinus tachycardia  Axis: Normal  Intervals:none  ST&T Change: No ST segment elevation or depression.  No abnormal T wave  inversion.  ____________________________________________  RADIOLOGY  Chest x-ray without acute process.  CT angiography without pulmonary embolus. ____________________________________________   PROCEDURES  Procedure(s) performed:   Procedures  Critical Care performed:   ____________________________________________   INITIAL IMPRESSION / ASSESSMENT AND PLAN / ED COURSE  Pertinent labs & imaging results that were available during my care of the patient were reviewed by me and considered in my medical decision making (see chart for details).  Differential diagnosis includes, but is not limited to, ACS, aortic dissection, pulmonary embolism, cardiac tamponade, pneumothorax, pneumonia, pericarditis, myocarditis, GI-related causes including esophagitis/gastritis, and musculoskeletal chest wall pain.   As part of my medical decision making, I reviewed the following data within the electronic MEDICAL RECORD NUMBER Notes from prior ED visits  ----------------------------------------- 7:59  PM on 11/01/2017 -----------------------------------------  Patient with elevated d-dimer.  However, CT angiography negative.  Patient will be discharged at this time.  Patient to follow-up in the office.  Recommended trying icy hot or Aspercreme.  Patient low cardiac risk.  Discussed labs as well as imaging with the patient as well as need for follow-up.  She is understanding and willing to comply. ____________________________________________   FINAL CLINICAL IMPRESSION(S) / ED DIAGNOSES  Chest pain.  NEW MEDICATIONS STARTED DURING THIS VISIT:  New Prescriptions   No medications on file     Note:  This document was prepared using Dragon voice recognition software and may include unintentional dictation errors.     Orbie Pyo, MD 11/01/17 [phone]

## 2017-11-01 NOTE — ED Notes (Signed)
Pt is going to medical imaging.   

## 2018-01-12 ENCOUNTER — Emergency Department
Admission: EM | Admit: 2018-01-12 | Discharge: 2018-01-12 | Disposition: A | Discharge status: Home / Self Care | Payer: Self-pay | Attending: Emergency Medicine | Admitting: Emergency Medicine

## 2018-01-12 ENCOUNTER — Other Ambulatory Visit: Payer: Self-pay

## 2018-01-12 DIAGNOSIS — E119 Type 2 diabetes mellitus without complications: Secondary | ICD-10-CM | POA: Insufficient documentation

## 2018-01-12 DIAGNOSIS — R69 Illness, unspecified: Secondary | ICD-10-CM

## 2018-01-12 DIAGNOSIS — J111 Influenza due to unidentified influenza virus with other respiratory manifestations: Secondary | ICD-10-CM | POA: Insufficient documentation

## 2018-01-12 DIAGNOSIS — F17210 Nicotine dependence, cigarettes, uncomplicated: Secondary | ICD-10-CM | POA: Insufficient documentation

## 2018-01-12 DIAGNOSIS — F319 Bipolar disorder, unspecified: Secondary | ICD-10-CM | POA: Insufficient documentation

## 2018-01-12 LAB — COMPREHENSIVE METABOLIC PANEL
ALT: 17 U/L (ref 0–44)
ANION GAP: 9 (ref 5–15)
AST: 18 U/L (ref 15–41)
Albumin: 3.8 g/dL (ref 3.5–5.0)
Alkaline Phosphatase: 79 U/L (ref 38–126)
BUN: 10 mg/dL (ref 6–20)
CALCIUM: 8.4 mg/dL — AB (ref 8.9–10.3)
CHLORIDE: 104 mmol/L (ref 98–111)
CO2: 22 mmol/L (ref 22–32)
CREATININE: 0.8 mg/dL (ref 0.44–1.00)
Glucose, Bld: 116 mg/dL — ABNORMAL HIGH (ref 70–99)
Potassium: 3.1 mmol/L — ABNORMAL LOW (ref 3.5–5.1)
Sodium: 135 mmol/L (ref 135–145)
Total Bilirubin: 0.6 mg/dL (ref 0.3–1.2)
Total Protein: 7.7 g/dL (ref 6.5–8.1)

## 2018-01-12 LAB — URINALYSIS, COMPLETE (UACMP) WITH MICROSCOPIC
BILIRUBIN URINE: NEGATIVE
GLUCOSE, UA: NEGATIVE mg/dL
HGB URINE DIPSTICK: NEGATIVE
Ketones, ur: 5 mg/dL — AB
LEUKOCYTES UA: NEGATIVE
NITRITE: NEGATIVE
PH: 5 (ref 5.0–8.0)
Protein, ur: 30 mg/dL — AB
Specific Gravity, Urine: 1.031 — ABNORMAL HIGH (ref 1.005–1.030)

## 2018-01-12 LAB — CBC
HCT: 33.8 % — ABNORMAL LOW (ref 36.0–46.0)
Hemoglobin: 10.1 g/dL — ABNORMAL LOW (ref 12.0–15.0)
MCH: 22.2 pg — ABNORMAL LOW (ref 26.0–34.0)
MCHC: 29.9 g/dL — ABNORMAL LOW (ref 30.0–36.0)
MCV: 74.4 fL — AB (ref 80.0–100.0)
NRBC: 0 % (ref 0.0–0.2)
Platelets: 452 10*3/uL — ABNORMAL HIGH (ref 150–400)
RBC: 4.54 MIL/uL (ref 3.87–5.11)
RDW: 14.8 % (ref 11.5–15.5)
WBC: 6.5 10*3/uL (ref 4.0–10.5)

## 2018-01-12 LAB — LIPASE, BLOOD: Lipase: 24 U/L (ref 11–51)

## 2018-01-12 LAB — INFLUENZA PANEL BY PCR (TYPE A & B)
Influenza A By PCR: NEGATIVE
Influenza B By PCR: NEGATIVE

## 2018-01-12 MED ORDER — ONDANSETRON HCL 4 MG PO TABS: 4.0000 mg | ORAL_TABLET | Freq: Three times a day (TID) | ORAL | 1 refills | Status: AC | PRN

## 2018-01-12 MED ORDER — BENZONATATE 100 MG PO CAPS: 100.0000 mg | ORAL_CAPSULE | Freq: Three times a day (TID) | ORAL | 0 refills | Status: AC | PRN

## 2018-01-12 MED ORDER — ACETAMINOPHEN 500 MG PO TABS
1000.0000 mg | ORAL_TABLET | Freq: Once | ORAL | Status: AC
  Administered 2018-01-12: 1000 mg via ORAL

## 2018-01-12 MED ORDER — DICYCLOMINE HCL 10 MG PO CAPS: 10.0000 mg | ORAL_CAPSULE | Freq: Three times a day (TID) | ORAL | 0 refills | Status: DC

## 2018-01-12 MED ORDER — ACETAMINOPHEN 500 MG PO TABS
ORAL_TABLET | ORAL | Status: AC
  Filled 2018-01-12: qty 2

## 2018-01-12 NOTE — ED Notes (Signed)
Resent urine

## 2018-01-12 NOTE — ED Provider Notes (Signed)
[facility] Emergency Department Provider Note  ____________________________________________  Time seen: Approximately 11:00 PM  I have reviewed the triage vital signs and the nursing notes.   HISTORY  Chief Complaint Fever and Emesis    HPI Tara Estes is a 27 y.o. female presents to the emergency department with headache, diarrhea, emesis, fever, rhinorrhea and nonproductive cough for the past 24 hours.  No recent travel.  Patient reports that she works in a [employer] and has numerous sick contacts.  Patient has been drinking Pedialyte today and has been tolerating fluids by mouth.  No chest pain, chest tightness or abdominal pain.  Past Medical History:  Diagnosis Date  . Arthritis   . Bipolar 1 disorder ([place])   . Diabetes mellitus     There are no active problems to display for this patient.   History reviewed. No pertinent surgical history.  Prior to Admission medications   Medication Sig Start Date End Date Taking? Authorizing Provider  benzonatate (TESSALON PERLES) 100 MG capsule Take 1 capsule (100 mg total) by mouth 3 (three) times daily as needed for up to 7 days for cough. 01/12/18 01/19/18  Lannie Fields, PA-C  cephALEXin (KEFLEX) 500 MG capsule Take 1 capsule (500 mg total) by mouth 3 (three) times daily. 03/28/17   Domenic Moras, PA-C  diclofenac (VOLTAREN) 75 MG EC tablet Take 75 mg by mouth 2 (two) times daily as needed for mild pain.  06/01/15   [provider]  dicyclomine (BENTYL) 10 MG capsule Take 1 capsule (10 mg total) by mouth 3 (three) times daily before meals for 3 days. 01/12/18 01/15/18  Lannie Fields, PA-C  diphenhydrAMINE (BENADRYL) 25 mg capsule Take 1 capsule (25 mg total) by mouth every 4 (four) hours as needed. 01/14/17 01/14/18  Laban Emperor, PA-C  methocarbamol (ROBAXIN) 500 MG tablet Take 500 mg by mouth every 6 (six) hours as needed for muscle spasms.    [provider]  ondansetron (ZOFRAN ODT) 4  MG disintegrating tablet Take 1 tablet (4 mg total) by mouth every 8 (eight) hours as needed for nausea or vomiting. 07/03/15   Deno Etienne, DO  ondansetron (ZOFRAN ODT) 4 MG disintegrating tablet Take 1 tablet (4 mg total) by mouth every 8 (eight) hours as needed for nausea or vomiting. 07/17/16   Earleen Newport, MD  ondansetron (ZOFRAN) 4 MG tablet Take 1 tablet (4 mg total) by mouth every 8 (eight) hours as needed for up to 3 days for nausea or vomiting. 01/12/18 01/15/18  Lannie Fields, PA-C  predniSONE (DELTASONE) 10 MG tablet Take 6 tablets on day 1, take 5 tablets on day 2, take 4 tablets on day 3, take 3 tablets on day 4, take 2 tablets on day 5, take 1 tablet on day 6 01/14/17   Laban Emperor, PA-C    Allergies Other  No family history on file.  Social History Social History   Tobacco Use  . Smoking status: Current Every Day Smoker    Types: Cigarettes  . Smokeless tobacco: Never Used  Substance Use Topics  . Alcohol use: Yes  . Drug use: No      Review of Systems  Constitutional: Patient has fever.  Eyes: No visual changes. No discharge ENT: Patient has congestion.  Cardiovascular: no chest pain. Respiratory: Patient has cough.  Gastrointestinal: No abdominal pain. Patient has had vomiting. Patient had diarrhea.  Genitourinary: Negative for dysuria. No hematuria Musculoskeletal: Patient has myalgias.  Skin: Negative for  rash, abrasions, lacerations, ecchymosis. Neurological: Patient has headache, no focal weakness or numbness.     ____________________________________________   PHYSICAL EXAM:  VITAL SIGNS: ED Triage Vitals [01/12/18 1732]  Enc Vitals Group     BP (!) 145/92     Pulse Rate (!) 114     Resp 20     Temp (!) 102.1 F (38.9 C)     Temp Source Oral     SpO2 99 %     Weight 205 lb (93 kg)     Height 5\' 7"  (1.702 m)     Head Circumference      Peak Flow      Pain Score 10     Pain Loc      Pain Edu?      Excl. in [place]?       Constitutional: Alert and oriented. Patient is lying supine. Eyes: Conjunctivae are normal. PERRL. EOMI. Head: Atraumatic. ENT:      Ears: Tympanic membranes are mildly injected with mild effusion bilaterally.       Nose: No congestion/rhinnorhea.      Mouth/Throat: Mucous membranes are moist. Posterior pharynx is mildly erythematous.  Hematological/Lymphatic/Immunilogical: No cervical lymphadenopathy.  Cardiovascular: Normal rate, regular rhythm. Normal S1 and S2.  Good peripheral circulation. Respiratory: Normal respiratory effort without tachypnea or retractions. Lungs CTAB. Good air entry to the bases with no decreased or absent breath sounds. Gastrointestinal: Bowel sounds 4 quadrants. Soft and nontender to palpation. No guarding or rigidity. No palpable masses. No distention. No CVA tenderness. Musculoskeletal: Full range of motion to all extremities. No gross deformities appreciated. Neurologic:  Normal speech and language. No gross focal neurologic deficits are appreciated.  Skin:  Skin is warm, dry and intact. No rash noted. Psychiatric: Mood and affect are normal. Speech and behavior are normal. Patient exhibits appropriate insight and judgement.    ____________________________________________   LABS (all labs ordered are listed, but only abnormal results are displayed)  Labs Reviewed  COMPREHENSIVE METABOLIC PANEL - Abnormal; Notable for the following components:      Result Value   Potassium 3.1 (*)    Glucose, Bld 116 (*)    Calcium 8.4 (*)    All other components within normal limits  CBC - Abnormal; Notable for the following components:   Hemoglobin 10.1 (*)    HCT 33.8 (*)    MCV 74.4 (*)    MCH 22.2 (*)    MCHC 29.9 (*)    Platelets 452 (*)    All other components within normal limits  URINALYSIS, COMPLETE (UACMP) WITH MICROSCOPIC - Abnormal; Notable for the following components:   Color, Urine AMBER (*)    APPearance HAZY (*)    Specific Gravity,  Urine 1.031 (*)    Ketones, ur 5 (*)    Protein, ur 30 (*)    Bacteria, UA RARE (*)    All other components within normal limits  LIPASE, BLOOD  INFLUENZA PANEL BY PCR (TYPE A & B)  POC URINE PREG, ED   ____________________________________________  EKG   ____________________________________________  RADIOLOGY   No results found.  ____________________________________________    PROCEDURES  Procedure(s) performed:    Procedures    Medications  acetaminophen (TYLENOL) tablet 1,000 mg (1,000 mg Oral Given 01/12/18 1735)     ____________________________________________   INITIAL IMPRESSION / ASSESSMENT AND PLAN / ED COURSE  Pertinent labs & imaging results that were available during my care of the patient were reviewed by me and considered  in my medical decision making (see chart for details).  Review of the [place] CSRS was performed in accordance of the [place] prior to dispensing any controlled drugs.      Assessment and plan Influenza-like illness Patient presents to the emergency department with flulike symptoms.  Influenza A and B testing were negative in the emergency department.  CBC and BMP was reassuring.  Urinalysis was noncontributory for cystitis.  Viral infection is likely at this time.  Rest and hydration were encouraged.  Patient was discharged with Tessalon Perles, Zofran and Bentyl.  She was advised to follow-up with primary care as needed.  All patient questions were answered.   ____________________________________________  FINAL CLINICAL IMPRESSION(S) / ED DIAGNOSES  Final diagnoses:  Influenza-like illness      NEW MEDICATIONS STARTED DURING THIS VISIT:  ED Discharge Orders         Ordered    benzonatate (TESSALON PERLES) 100 MG capsule  3 times daily PRN     01/12/18 2243    dicyclomine (BENTYL) 10 MG capsule  3 times daily before meals     01/12/18 2243    ondansetron (ZOFRAN) 4 MG tablet  Every 8 hours PRN     01/12/18 2243               This chart was dictated using voice recognition software/Dragon. Despite best efforts to proofread, errors can occur which can change the meaning. Any change was purely unintentional.    Lannie Fields, PA-C 01/12/18 2304    Lavonia Drafts, MD 01/13/18 [phone]

## 2018-01-12 NOTE — ED Triage Notes (Signed)
First Nurse Note:  C/O N/V/D and body chills x 1 day.  States has been drinking pedialyte to maintain hydration.    AAOx3.  Skin warm and dry. NAD

## 2018-01-12 NOTE — ED Triage Notes (Signed)
Pt comes via POV from home with c/o fever, vomiting and generalized aches all over. Pt states this started last night.   Pt states she hasn't been able to keep anything down.  Temp of 102.1 in triage

## 2018-02-13 ENCOUNTER — Emergency Department
Admission: EM | Admit: 2018-02-13 | Discharge: 2018-02-13 | Disposition: A | Discharge status: Home / Self Care | Payer: Self-pay | Attending: Emergency Medicine | Admitting: Emergency Medicine

## 2018-02-13 ENCOUNTER — Other Ambulatory Visit: Payer: Self-pay

## 2018-02-13 ENCOUNTER — Encounter: Payer: Self-pay | Admitting: Emergency Medicine

## 2018-02-13 DIAGNOSIS — E119 Type 2 diabetes mellitus without complications: Secondary | ICD-10-CM | POA: Insufficient documentation

## 2018-02-13 DIAGNOSIS — M791 Myalgia, unspecified site: Secondary | ICD-10-CM | POA: Insufficient documentation

## 2018-02-13 DIAGNOSIS — J101 Influenza due to other identified influenza virus with other respiratory manifestations: Secondary | ICD-10-CM | POA: Insufficient documentation

## 2018-02-13 DIAGNOSIS — R05 Cough: Secondary | ICD-10-CM | POA: Insufficient documentation

## 2018-02-13 DIAGNOSIS — F17210 Nicotine dependence, cigarettes, uncomplicated: Secondary | ICD-10-CM | POA: Insufficient documentation

## 2018-02-13 DIAGNOSIS — R070 Pain in throat: Secondary | ICD-10-CM | POA: Insufficient documentation

## 2018-02-13 LAB — INFLUENZA PANEL BY PCR (TYPE A & B)
INFLAPCR: POSITIVE — AB
Influenza B By PCR: NEGATIVE

## 2018-02-13 MED ORDER — OSELTAMIVIR PHOSPHATE 75 MG PO CAPS: 75.0000 mg | ORAL_CAPSULE | Freq: Two times a day (BID) | ORAL | 0 refills | Status: DC

## 2018-02-13 MED ORDER — ACETAMINOPHEN 500 MG PO TABS
1000.0000 mg | ORAL_TABLET | Freq: Once | ORAL | Status: AC
  Administered 2018-02-13: 1000 mg via ORAL
  Filled 2018-02-13: qty 2

## 2018-02-13 NOTE — ED Provider Notes (Signed)
[facility] Emergency Department Provider Note  ____________________________________________   First MD Initiated Contact with Patient 02/13/18 2217     (approximate)  I have reviewed the triage vital signs and the nursing notes.   HISTORY  Chief Complaint Fever    HPI Latayna Ritchie is a 28 y.o. female presents to the ER with flulike symptoms, patient is complained of fever, chills, body aches, dry cough, mild sore throat, denies vomiting, denies diarrhea; denies chest pain or sob.  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   Past Medical History:  Diagnosis Date  . Arthritis   . Bipolar 1 disorder ([place])   . Diabetes mellitus     There are no active problems to display for this patient.   History reviewed. No pertinent surgical history.  Prior to Admission medications   Medication Sig Start Date End Date Taking? Authorizing Provider  oseltamivir (TAMIFLU) 75 MG capsule Take 1 capsule (75 mg total) by mouth 2 (two) times daily. 02/13/18   Versie Starks, PA-C    Allergies Other  No family history on file.  Social History Social History   Tobacco Use  . Smoking status: Current Every Day Smoker    Types: Cigarettes  . Smokeless tobacco: Never Used  Substance Use Topics  . Alcohol use: Yes  . Drug use: No    Review of Systems  Constitutional: Positive fever/chills Eyes: No visual changes. ENT: Positive sore throat. Respiratory: Positive cough Genitourinary: Negative for dysuria. Musculoskeletal: Negative for back pain. Skin: Negative for rash.    ____________________________________________   PHYSICAL EXAM:  VITAL SIGNS: ED Triage Vitals  Enc Vitals Group     BP 02/13/18 2115 140/80     Pulse Rate 02/13/18 2115 (!) 110     Resp 02/13/18 2115 20     Temp 02/13/18 2115 (!) 103 F (39.4 C)     Temp Source 02/13/18 2115 Oral     SpO2 02/13/18 2115 100 %     Weight 02/13/18 2113  275 lb (124.7 kg)     Height 02/13/18 2113 5\' 7"  (1.702 m)     Head Circumference --      Peak Flow --      Pain Score 02/13/18 2113 10     Pain Loc --      Pain Edu? --      Excl. in [place]? --     Constitutional: Alert and oriented. Well appearing and in no acute distress.  Febrile, Eyes: Conjunctivae are normal.  Head: Atraumatic. Nose: No congestion/rhinnorhea. Mouth/Throat: Mucous membranes are moist.  Throat appears normal Neck:  supple no lymphadenopathy noted Cardiovascular: Tachycardic, regular rhythm. Heart sounds are normal Respiratory: Normal respiratory effort.  No retractions, lungs c t a  GU: deferred Musculoskeletal: FROM all extremities, warm and well perfused Neurologic:  Normal speech and language.  Skin:  Skin is warm, dry and intact. No rash noted. Psychiatric: Mood and affect are normal. Speech and behavior are normal.  ____________________________________________   LABS (all labs ordered are listed, but only abnormal results are displayed)  Labs Reviewed  INFLUENZA PANEL BY PCR (TYPE A & B) - Abnormal; Notable for the following components:      Result Value   Influenza A By PCR POSITIVE (*)    All other components within normal limits   ____________________________________________   ____________________________________________  RADIOLOGY    ____________________________________________   PROCEDURES  Procedure(s) performed:  No  Procedures    ____________________________________________   INITIAL IMPRESSION / ASSESSMENT AND PLAN / ED COURSE  Pertinent labs & imaging results that were available during my care of the patient were reviewed by me and considered in my medical decision making (see chart for details).   Patient is 28 year old female presents to the emergency department with flulike symptoms.  Physical exam shows a febrile tachycardic female.  Lungs are clear to all station, remainder the exam is unremarkable  Influenza test  is positive for a  On recheck when I examined the patient the temperature had decreased to 100.5   Explained the findings to the patient.  She is to take Tylenol/ibuprofen for the fever.  Drink plenty fluids.  Skin prescription for Tamiflu.  Given a work note stating she should not return until she has been fever free for 24 to 48 hours.  She states she understands and will comply.  She is discharged in stable condition.  As part of my medical decision making, I reviewed the following data within the [place] notes reviewed and incorporated, Labs reviewed influenza test is positive for a, Old chart reviewed, Notes from prior ED visits and [place] Controlled Substance Database  ____________________________________________   FINAL CLINICAL IMPRESSION(S) / ED DIAGNOSES  Final diagnoses:  Influenza A      NEW MEDICATIONS STARTED DURING THIS VISIT:  New Prescriptions   OSELTAMIVIR (TAMIFLU) 75 MG CAPSULE    Take 1 capsule (75 mg total) by mouth 2 (two) times daily.     Note:  This document was prepared using Dragon voice recognition software and may include unintentional dictation errors.    Versie Starks, PA-C 02/13/18 2251    Schaevitz, Randall An, MD 02/13/18 [phone]

## 2018-02-13 NOTE — ED Triage Notes (Signed)
Patient ambulatory to triage with steady gait, without difficulty or distress noted, mask in place; pt reports body aches & fever since yesterday with cough & congestion; no meds taken PTA

## 2018-02-13 NOTE — Discharge Instructions (Signed)
Follow-up with your regular doctor if not better in 2 to 3 days.  Return emergency department worsening.  Take Tylenol/ibuprofen for fever as needed.  Drink plenty of fluids this will help with the body aches.  Prescription for Tamiflu is been sent to [facility] for you.  You actually have influenza a if anyone asked.

## 2018-03-23 ENCOUNTER — Other Ambulatory Visit: Payer: Self-pay

## 2018-03-23 ENCOUNTER — Emergency Department: Payer: Self-pay

## 2018-03-23 ENCOUNTER — Encounter: Payer: Self-pay | Admitting: Emergency Medicine

## 2018-03-23 ENCOUNTER — Emergency Department
Admission: EM | Admit: 2018-03-23 | Discharge: 2018-03-23 | Disposition: A | Discharge status: Home / Self Care | Payer: Self-pay | Attending: Emergency Medicine | Admitting: Emergency Medicine

## 2018-03-23 DIAGNOSIS — E119 Type 2 diabetes mellitus without complications: Secondary | ICD-10-CM | POA: Insufficient documentation

## 2018-03-23 DIAGNOSIS — F17210 Nicotine dependence, cigarettes, uncomplicated: Secondary | ICD-10-CM | POA: Insufficient documentation

## 2018-03-23 DIAGNOSIS — R0981 Nasal congestion: Secondary | ICD-10-CM | POA: Insufficient documentation

## 2018-03-23 DIAGNOSIS — R112 Nausea with vomiting, unspecified: Secondary | ICD-10-CM | POA: Insufficient documentation

## 2018-03-23 LAB — INFLUENZA PANEL BY PCR (TYPE A & B)
Influenza A By PCR: NEGATIVE
Influenza B By PCR: NEGATIVE

## 2018-03-23 LAB — GROUP A STREP BY PCR: Group A Strep by PCR: NOT DETECTED

## 2018-03-23 MED ORDER — AMOXICILLIN 875 MG PO TABS: 875.0000 mg | ORAL_TABLET | Freq: Two times a day (BID) | ORAL | 0 refills | Status: AC

## 2018-03-23 MED ORDER — ONDANSETRON 4 MG PO TBDP
4.0000 mg | ORAL_TABLET | Freq: Once | ORAL | Status: AC
  Administered 2018-03-23: 4 mg via ORAL
  Filled 2018-03-23: qty 1

## 2018-03-23 MED ORDER — ONDANSETRON 8 MG PO TBDP
8.0000 mg | ORAL_TABLET | Freq: Once | ORAL | Status: AC
  Administered 2018-03-23: 8 mg via ORAL
  Filled 2018-03-23: qty 1

## 2018-03-23 MED ORDER — TRIAMCINOLONE ACETONIDE 0.025 % EX OINT: 1.0000 "application " | TOPICAL_OINTMENT | Freq: Two times a day (BID) | CUTANEOUS | 0 refills | Status: AC

## 2018-03-23 MED ORDER — ONDANSETRON HCL 4 MG PO TABS: 4.0000 mg | ORAL_TABLET | Freq: Three times a day (TID) | ORAL | 0 refills | Status: AC | PRN

## 2018-03-23 NOTE — ED Triage Notes (Signed)
Congestion and sore throat x 3 days.

## 2018-03-23 NOTE — ED Notes (Signed)
Reference triage note. Pt respirations even and unlabored. Pt in NAD.

## 2018-03-23 NOTE — ED Provider Notes (Signed)
[facility] Emergency Department Provider Note  ____________________________________________  Time seen: Approximately 6:14 PM  I have reviewed the triage vital signs and the nursing notes.   HISTORY  Chief Complaint Sore Throat and Nasal Congestion    HPI Tara Estes is a 28 y.o. female presents to the emergency department stating with pharyngitis, chills and emesis that started last night.  Patient has had sporadic cough.  She has not taken her temperature at home.  Patient has not been able to drink without emesis since her symptoms started.  No diarrhea.  No other sick contacts within the home.  Patient denies chest tightness, chest pain and abdominal pain.  No other alleviating measures have been attempted.         Past Medical History:  Diagnosis Date  . Arthritis   . Bipolar 1 disorder ([place])   . Diabetes mellitus     There are no active problems to display for this patient.   History reviewed. No pertinent surgical history.  Prior to Admission medications   Medication Sig Start Date End Date Taking? Authorizing Provider  amoxicillin (AMOXIL) 875 MG tablet Take 1 tablet (875 mg total) by mouth 2 (two) times daily for 10 days. 03/23/18 04/02/18  Lannie Fields, PA-C  ondansetron (ZOFRAN) 4 MG tablet Take 1 tablet (4 mg total) by mouth every 8 (eight) hours as needed for up to 5 days for nausea or vomiting. 03/23/18 03/28/18  Lannie Fields, PA-C  oseltamivir (TAMIFLU) 75 MG capsule Take 1 capsule (75 mg total) by mouth 2 (two) times daily. 02/13/18   Versie Starks, PA-C    Allergies Other  No family history on file.  Social History Social History   Tobacco Use  . Smoking status: Current Every Day Smoker    Types: Cigarettes  . Smokeless tobacco: Never Used  Substance Use Topics  . Alcohol use: Yes  . Drug use: No     Review of Systems  Constitutional: No fever/chills Eyes: No visual changes. No discharge ENT: Patient has  pharyngitis.  Cardiovascular: no chest pain. Respiratory: no cough. No SOB. Gastrointestinal: No abdominal pain. Patient has emesis. No diarrhea.  No constipation. Genitourinary: Negative for dysuria. No hematuria Musculoskeletal: Negative for musculoskeletal pain. Skin: Negative for rash, abrasions, lacerations, ecchymosis. Neurological: Negative for headaches, focal weakness or numbness.   ____________________________________________   PHYSICAL EXAM:  VITAL SIGNS: ED Triage Vitals  Enc Vitals Group     BP 03/23/18 1737 (!) 191/110     Pulse Rate 03/23/18 1737 (!) 102     Resp 03/23/18 1737 18     Temp 03/23/18 1737 98.6 F (37 C)     Temp Source 03/23/18 1737 Oral     SpO2 03/23/18 1737 100 %     Weight 03/23/18 1738 262 lb (118.8 kg)     Height 03/23/18 1738 5\' 7"  (1.702 m)     Head Circumference --      Peak Flow --      Pain Score 03/23/18 1738 7     Pain Loc --      Pain Edu? --      Excl. in [place]? --      Constitutional: Alert and oriented. Well appearing and in no acute distress. Eyes: Conjunctivae are normal. PERRL. EOMI. Head: Atraumatic. ENT:      Ears: TMs are injected bilaterally.      Nose: No congestion/rhinnorhea.      Mouth/Throat: Mucous membranes are moist.  Posterior pharynx is erythematous with bilateral tonsillar hypertrophy.  Exudates are also visualized. Neck: No stridor.  No cervical spine tenderness to palpation. Hematological/Lymphatic/Immunilogical: Papable cervical lymphadenopathy.  Cardiovascular: Normal rate, regular rhythm. Normal S1 and S2.  Good peripheral circulation. Respiratory: Normal respiratory effort without tachypnea or retractions. Lungs CTAB. Good air entry to the bases with no decreased or absent breath sounds. Gastrointestinal: Bowel sounds 4 quadrants. Soft and nontender to palpation. No guarding or rigidity. No palpable masses. No distention. No CVA tenderness. Musculoskeletal: Full range of motion to all extremities. No  gross deformities appreciated. Neurologic:  Normal speech and language. No gross focal neurologic deficits are appreciated.  Skin:  Skin is warm, dry and intact. No rash noted. Psychiatric: Mood and affect are normal. Speech and behavior are normal. Patient exhibits appropriate insight and judgement.   ____________________________________________   LABS (all labs ordered are listed, but only abnormal results are displayed)  Labs Reviewed  GROUP A STREP BY PCR  INFLUENZA PANEL BY PCR (TYPE A & B)  CBG MONITORING, ED   ____________________________________________  EKG   ____________________________________________  RADIOLOGY   Dg Chest 2 View  Result Date: 03/23/2018 CLINICAL DATA:  Cough, fever EXAM: CHEST - 2 VIEW COMPARISON:  11/01/2017 FINDINGS: Heart and mediastinal contours are within normal limits. No focal opacities or effusions. No acute bony abnormality. IMPRESSION: No active cardiopulmonary disease. Electronically Signed   By: Rolm Baptise M.D.   On: 03/23/2018 18:59    ____________________________________________    PROCEDURES  Procedure(s) performed:    Procedures    Medications  ondansetron (ZOFRAN-ODT) disintegrating tablet 4 mg (has no administration in time range)  ondansetron (ZOFRAN-ODT) disintegrating tablet 8 mg (8 mg Oral Given 03/23/18 1832)     ____________________________________________   INITIAL IMPRESSION / ASSESSMENT AND PLAN / ED COURSE  Pertinent labs & imaging results that were available during my care of the patient were reviewed by me and considered in my medical decision making (see chart for details).  Review of the [place] CSRS was performed in accordance of the [place] prior to dispensing any controlled drugs.           Assessment and plan:  Emesis Patient presents to the emergency department with emesis for the past 24 hours.  Patient was tested for strep and flu in the emergency department which were both negative.  Patient  has had multiple viral upper respiratory tract infections in the past several months.  No consolidations, opacities or infiltrates were visualized on x-ray examination of the chest.  Patient's posterior pharynx was erythematous with tonsillar hypertrophy and exudate.  I treated patient with amoxicillin for strep throat.  Patient was also discharged with Zofran.  Patient was given strict return precautions to return to the emergency department for new or worsening symptoms.  Patient declined blood work in the emergency department as well as supplemental fluids.   ____________________________________________  FINAL CLINICAL IMPRESSION(S) / ED DIAGNOSES  Final diagnoses:  Non-intractable vomiting with nausea, unspecified vomiting type      NEW MEDICATIONS STARTED DURING THIS VISIT:  ED Discharge Orders         Ordered    ondansetron (ZOFRAN) 4 MG tablet  Every 8 hours PRN     03/23/18 1959    amoxicillin (AMOXIL) 875 MG tablet  2 times daily     03/23/18 1959              This chart was dictated using voice recognition software/Dragon. Despite best efforts to  proofread, errors can occur which can change the meaning. Any change was purely unintentional.    Karren Cobble 03/23/18 2004    Carrie Mew, MD 03/26/18 [phone]

## 2019-03-07 ENCOUNTER — Emergency Department: Payer: Self-pay

## 2019-03-07 ENCOUNTER — Encounter: Payer: Self-pay | Admitting: Emergency Medicine

## 2019-03-07 ENCOUNTER — Other Ambulatory Visit: Payer: Self-pay

## 2019-03-07 ENCOUNTER — Ambulatory Visit: Payer: Self-pay | Admitting: Certified Registered Nurse Anesthetist

## 2019-03-07 ENCOUNTER — Encounter
Admission: EM | Disposition: A | Discharge status: Home / Self Care | Payer: Self-pay | Source: Home / Self Care | Attending: Student in an Organized Health Care Education/Training Program

## 2019-03-07 ENCOUNTER — Ambulatory Visit
Admission: EM | Admit: 2019-03-07 | Discharge: 2019-03-07 | Disposition: A | Discharge status: Home / Self Care | Payer: Self-pay | Attending: Obstetrics & Gynecology | Admitting: Obstetrics & Gynecology

## 2019-03-07 DIAGNOSIS — M1990 Unspecified osteoarthritis, unspecified site: Secondary | ICD-10-CM | POA: Insufficient documentation

## 2019-03-07 DIAGNOSIS — F17210 Nicotine dependence, cigarettes, uncomplicated: Secondary | ICD-10-CM | POA: Insufficient documentation

## 2019-03-07 DIAGNOSIS — N9489 Other specified conditions associated with female genital organs and menstrual cycle: Secondary | ICD-10-CM | POA: Insufficient documentation

## 2019-03-07 DIAGNOSIS — N800 Endometriosis of uterus: Secondary | ICD-10-CM | POA: Insufficient documentation

## 2019-03-07 DIAGNOSIS — F319 Bipolar disorder, unspecified: Secondary | ICD-10-CM | POA: Insufficient documentation

## 2019-03-07 DIAGNOSIS — Z833 Family history of diabetes mellitus: Secondary | ICD-10-CM | POA: Insufficient documentation

## 2019-03-07 DIAGNOSIS — N83209 Unspecified ovarian cyst, unspecified side: Secondary | ICD-10-CM

## 2019-03-07 DIAGNOSIS — N83201 Unspecified ovarian cyst, right side: Secondary | ICD-10-CM | POA: Insufficient documentation

## 2019-03-07 DIAGNOSIS — N83202 Unspecified ovarian cyst, left side: Secondary | ICD-10-CM | POA: Insufficient documentation

## 2019-03-07 DIAGNOSIS — Z9889 Other specified postprocedural states: Secondary | ICD-10-CM

## 2019-03-07 DIAGNOSIS — Z20822 Contact with and (suspected) exposure to covid-19: Secondary | ICD-10-CM | POA: Insufficient documentation

## 2019-03-07 DIAGNOSIS — E119 Type 2 diabetes mellitus without complications: Secondary | ICD-10-CM | POA: Insufficient documentation

## 2019-03-07 HISTORY — PX: LAPAROSCOPY: SHX197

## 2019-03-07 LAB — COMPREHENSIVE METABOLIC PANEL
ALT: 16 U/L (ref 0–44)
AST: 18 U/L (ref 15–41)
Albumin: 3.8 g/dL (ref 3.5–5.0)
Alkaline Phosphatase: 82 U/L (ref 38–126)
Anion gap: 10 (ref 5–15)
BUN: 9 mg/dL (ref 6–20)
CO2: 23 mmol/L (ref 22–32)
Calcium: 8.9 mg/dL (ref 8.9–10.3)
Chloride: 105 mmol/L (ref 98–111)
Creatinine, Ser: 0.67 mg/dL (ref 0.44–1.00)
GFR calc Af Amer: >60 mL/min (ref >60)
GFR calc non Af Amer: >60 mL/min (ref >60)
Glucose, Bld: 106 mg/dL — ABNORMAL HIGH (ref 70–99)
Potassium: 3.2 mmol/L — ABNORMAL LOW (ref 3.5–5.1)
Sodium: 138 mmol/L (ref 135–145)
Total Bilirubin: 0.3 mg/dL (ref 0.3–1.2)
Total Protein: 8.4 g/dL — ABNORMAL HIGH (ref 6.5–8.1)

## 2019-03-07 LAB — URINALYSIS, COMPLETE (UACMP) WITH MICROSCOPIC
Bacteria, UA: NONE SEEN
Bilirubin Urine: NEGATIVE
Glucose, UA: NEGATIVE mg/dL
Ketones, ur: 5 mg/dL — AB
Leukocytes,Ua: NEGATIVE
Nitrite: NEGATIVE
Protein, ur: NEGATIVE mg/dL
Specific Gravity, Urine: 1.017 (ref 1.005–1.030)
pH: 7 (ref 5.0–8.0)

## 2019-03-07 LAB — CBC
HCT: 30.6 % — ABNORMAL LOW (ref 36.0–46.0)
Hemoglobin: 8.9 g/dL — ABNORMAL LOW (ref 12.0–15.0)
MCH: 20.9 pg — ABNORMAL LOW (ref 26.0–34.0)
MCHC: 29.1 g/dL — ABNORMAL LOW (ref 30.0–36.0)
MCV: 71.8 fL — ABNORMAL LOW (ref 80.0–100.0)
Platelets: 543 10*3/uL — ABNORMAL HIGH (ref 150–400)
RBC: 4.26 MIL/uL (ref 3.87–5.11)
RDW: 15.8 % — ABNORMAL HIGH (ref 11.5–15.5)
WBC: 8.9 10*3/uL (ref 4.0–10.5)
nRBC: 0 % (ref 0.0–0.2)

## 2019-03-07 LAB — RESPIRATORY PANEL BY RT PCR (FLU A&B, COVID)
Influenza A by PCR: NEGATIVE
Influenza B by PCR: NEGATIVE
SARS Coronavirus 2 by RT PCR: NEGATIVE

## 2019-03-07 LAB — TYPE AND SCREEN
ABO/RH(D): B POS
Antibody Screen: NEGATIVE

## 2019-03-07 LAB — POCT PREGNANCY, URINE: Preg Test, Ur: NEGATIVE

## 2019-03-07 LAB — LIPASE, BLOOD: Lipase: 21 U/L (ref 11–51)

## 2019-03-07 SURGERY — LAPAROSCOPY, DIAGNOSTIC
Anesthesia: General | Site: Abdomen

## 2019-03-07 MED ORDER — ACETAMINOPHEN 500 MG PO TABS
ORAL_TABLET | ORAL | Status: AC
  Administered 2019-03-07: 1000 mg via ORAL
  Filled 2019-03-07: qty 2

## 2019-03-07 MED ORDER — IBUPROFEN 600 MG PO TABS: 600.0000 mg | ORAL_TABLET | Freq: Four times a day (QID) | ORAL | 1 refills | Status: DC

## 2019-03-07 MED ORDER — PROMETHAZINE HCL 25 MG/ML IJ SOLN
12.5000 mg | Freq: Four times a day (QID) | INTRAMUSCULAR | Status: DC | PRN
  Administered 2019-03-07: 12.5 mg via INTRAVENOUS
  Filled 2019-03-07: qty 1

## 2019-03-07 MED ORDER — PROPOFOL 500 MG/50ML IV EMUL
INTRAVENOUS | Status: AC
  Filled 2019-03-07: qty 50

## 2019-03-07 MED ORDER — DEXTROSE 5 % IV SOLN
3.0000 g | INTRAVENOUS | Status: AC
  Administered 2019-03-07: 3 g via INTRAVENOUS
  Filled 2019-03-07: qty 3000

## 2019-03-07 MED ORDER — FENTANYL CITRATE (PF) 100 MCG/2ML IJ SOLN
25.0000 ug | INTRAMUSCULAR | Status: DC | PRN
  Administered 2019-03-07 (×3): 25 ug via INTRAVENOUS

## 2019-03-07 MED ORDER — DEXAMETHASONE SODIUM PHOSPHATE 10 MG/ML IJ SOLN
INTRAMUSCULAR | Status: AC
  Filled 2019-03-07: qty 1

## 2019-03-07 MED ORDER — SUGAMMADEX SODIUM 500 MG/5ML IV SOLN
INTRAVENOUS | Status: DC | PRN
  Administered 2019-03-07: 400 mg via INTRAVENOUS

## 2019-03-07 MED ORDER — PHENYLEPHRINE HCL (PRESSORS) 10 MG/ML IV SOLN
INTRAVENOUS | Status: DC | PRN
  Administered 2019-03-07 (×2): 200 ug via INTRAVENOUS
  Administered 2019-03-07: 300 ug via INTRAVENOUS

## 2019-03-07 MED ORDER — SUCCINYLCHOLINE CHLORIDE 20 MG/ML IJ SOLN
INTRAMUSCULAR | Status: DC | PRN
  Administered 2019-03-07: 140 mg via INTRAVENOUS

## 2019-03-07 MED ORDER — NORETHINDRONE ACETATE 5 MG PO TABS: 5.0000 mg | ORAL_TABLET | Freq: Every day | ORAL | 11 refills | Status: DC

## 2019-03-07 MED ORDER — OXYCODONE HCL 5 MG PO TABS: 5.0000 mg | ORAL_TABLET | ORAL | 0 refills | Status: DC | PRN

## 2019-03-07 MED ORDER — LIDOCAINE HCL (CARDIAC) PF 100 MG/5ML IV SOSY
PREFILLED_SYRINGE | INTRAVENOUS | Status: DC | PRN
  Administered 2019-03-07: 100 mg via INTRAVENOUS

## 2019-03-07 MED ORDER — FENTANYL CITRATE (PF) 100 MCG/2ML IJ SOLN
INTRAMUSCULAR | Status: AC
  Administered 2019-03-07: 25 ug via INTRAVENOUS
  Filled 2019-03-07: qty 2

## 2019-03-07 MED ORDER — SCOPOLAMINE 1 MG/3DAYS TD PT72
1.0000 | MEDICATED_PATCH | TRANSDERMAL | Status: DC
  Filled 2019-03-07: qty 1

## 2019-03-07 MED ORDER — PROMETHAZINE HCL 25 MG/ML IJ SOLN: 6.2500 mg | INTRAMUSCULAR | Status: DC | PRN

## 2019-03-07 MED ORDER — METOCLOPRAMIDE HCL 5 MG/ML IJ SOLN
INTRAMUSCULAR | Status: DC | PRN
  Administered 2019-03-07: 10 mg via INTRAVENOUS

## 2019-03-07 MED ORDER — FENTANYL CITRATE (PF) 100 MCG/2ML IJ SOLN
INTRAMUSCULAR | Status: DC | PRN
  Administered 2019-03-07 (×2): 50 ug via INTRAVENOUS

## 2019-03-07 MED ORDER — KETOROLAC TROMETHAMINE 15 MG/ML IJ SOLN
INTRAMUSCULAR | Status: AC
  Administered 2019-03-07: 15 mg via INTRAVENOUS
  Filled 2019-03-07: qty 1

## 2019-03-07 MED ORDER — LACTATED RINGERS IV SOLN: INTRAVENOUS | Status: DC | PRN

## 2019-03-07 MED ORDER — FENTANYL CITRATE (PF) 100 MCG/2ML IJ SOLN: 25.0000 ug | INTRAMUSCULAR | Status: DC | PRN

## 2019-03-07 MED ORDER — PHENYLEPHRINE HCL (PRESSORS) 10 MG/ML IV SOLN
INTRAVENOUS | Status: AC
  Filled 2019-03-07: qty 1

## 2019-03-07 MED ORDER — KETOROLAC TROMETHAMINE 15 MG/ML IJ SOLN
15.0000 mg | INTRAMUSCULAR | Status: AC
  Filled 2019-03-07: qty 1

## 2019-03-07 MED ORDER — KETOROLAC TROMETHAMINE 15 MG/ML IJ SOLN
INTRAMUSCULAR | Status: AC
  Filled 2019-03-07: qty 1

## 2019-03-07 MED ORDER — KETOROLAC TROMETHAMINE 15 MG/ML IJ SOLN
15.0000 mg | Freq: Four times a day (QID) | INTRAMUSCULAR | Status: DC
  Administered 2019-03-07: 15 mg via INTRAVENOUS

## 2019-03-07 MED ORDER — SILVER NITRATE-POT NITRATE 75-25 % EX MISC
CUTANEOUS | Status: DC | PRN
  Administered 2019-03-07: 2

## 2019-03-07 MED ORDER — SUCCINYLCHOLINE CHLORIDE 20 MG/ML IJ SOLN
INTRAMUSCULAR | Status: AC
  Filled 2019-03-07: qty 1

## 2019-03-07 MED ORDER — ROCURONIUM BROMIDE 100 MG/10ML IV SOLN
INTRAVENOUS | Status: DC | PRN
  Administered 2019-03-07: 50 mg via INTRAVENOUS

## 2019-03-07 MED ORDER — PROPOFOL 10 MG/ML IV BOLUS
INTRAVENOUS | Status: AC
  Filled 2019-03-07: qty 20

## 2019-03-07 MED ORDER — GABAPENTIN 300 MG PO CAPS: 600.0000 mg | ORAL_CAPSULE | ORAL | Status: AC

## 2019-03-07 MED ORDER — ONDANSETRON HCL 4 MG/2ML IJ SOLN
INTRAMUSCULAR | Status: AC
  Filled 2019-03-07: qty 2

## 2019-03-07 MED ORDER — FENTANYL CITRATE (PF) 100 MCG/2ML IJ SOLN
INTRAMUSCULAR | Status: AC
  Filled 2019-03-07: qty 2

## 2019-03-07 MED ORDER — MORPHINE SULFATE (PF) 4 MG/ML IV SOLN
4.0000 mg | INTRAVENOUS | Status: DC | PRN
  Administered 2019-03-07 (×3): 4 mg via INTRAVENOUS
  Filled 2019-03-07 (×3): qty 1

## 2019-03-07 MED ORDER — SCOPOLAMINE 1 MG/3DAYS TD PT72
MEDICATED_PATCH | TRANSDERMAL | Status: AC
  Administered 2019-03-07: 1.5 mg via TRANSDERMAL
  Filled 2019-03-07: qty 1

## 2019-03-07 MED ORDER — LACTATED RINGERS IV SOLN: INTRAVENOUS | Status: DC

## 2019-03-07 MED ORDER — SILVER NITRATE-POT NITRATE 75-25 % EX MISC
CUTANEOUS | Status: AC
  Filled 2019-03-07: qty 20

## 2019-03-07 MED ORDER — LIDOCAINE HCL (PF) 2 % IJ SOLN
INTRAMUSCULAR | Status: AC
  Filled 2019-03-07: qty 5

## 2019-03-07 MED ORDER — ACETAMINOPHEN 650 MG RE SUPP
650.0000 mg | RECTAL | Status: DC | PRN
  Filled 2019-03-07: qty 1

## 2019-03-07 MED ORDER — ACETAMINOPHEN 325 MG PO TABS: 650.0000 mg | ORAL_TABLET | ORAL | Status: DC | PRN

## 2019-03-07 MED ORDER — IOHEXOL 300 MG/ML  SOLN
125.0000 mL | Freq: Once | INTRAMUSCULAR | Status: AC | PRN
  Administered 2019-03-07: 125 mL via INTRAVENOUS

## 2019-03-07 MED ORDER — ONDANSETRON HCL 4 MG/2ML IJ SOLN
INTRAMUSCULAR | Status: DC | PRN
  Administered 2019-03-07: 4 mg via INTRAVENOUS

## 2019-03-07 MED ORDER — GABAPENTIN 300 MG PO CAPS
ORAL_CAPSULE | ORAL | Status: AC
  Administered 2019-03-07: 600 mg via ORAL
  Filled 2019-03-07: qty 2

## 2019-03-07 MED ORDER — PROPOFOL 10 MG/ML IV BOLUS
INTRAVENOUS | Status: DC | PRN
  Administered 2019-03-07: 300 mg via INTRAVENOUS

## 2019-03-07 MED ORDER — GLYCOPYRROLATE 0.2 MG/ML IJ SOLN
INTRAMUSCULAR | Status: DC | PRN
  Administered 2019-03-07: .2 mg via INTRAVENOUS

## 2019-03-07 MED ORDER — ACETAMINOPHEN 500 MG PO TABS: 1000.0000 mg | ORAL_TABLET | ORAL | Status: AC

## 2019-03-07 MED ORDER — SODIUM CHLORIDE 0.9 % IV SOLN: Freq: Once | INTRAVENOUS | Status: AC

## 2019-03-07 MED ORDER — HEPARIN SODIUM (PORCINE) 5000 UNIT/ML IJ SOLN
INTRAMUSCULAR | Status: AC
  Administered 2019-03-07: 5000 [IU] via SUBCUTANEOUS
  Filled 2019-03-07: qty 1

## 2019-03-07 MED ORDER — HEPARIN SODIUM (PORCINE) 5000 UNIT/ML IJ SOLN: 5000.0000 [IU] | INTRAMUSCULAR | Status: AC

## 2019-03-07 SURGICAL SUPPLY — 48 items
BAG URINE DRAIN 2000ML AR STRL (UROLOGICAL SUPPLIES) ×3 IMPLANT
BLADE SURG SZ11 CARB STEEL (BLADE) ×3 IMPLANT
CANISTER SUCT 1200ML W/VALVE (MISCELLANEOUS) ×3 IMPLANT
CATH FOLEY 2WAY  5CC 16FR (CATHETERS) ×2
CATH URTH 16FR FL 2W BLN LF (CATHETERS) ×1 IMPLANT
CHLORAPREP W/TINT 26 (MISCELLANEOUS) ×3 IMPLANT
COVER MAYO STAND REUSABLE (DRAPES) ×3 IMPLANT
COVER WAND RF STERILE (DRAPES) ×3 IMPLANT
DERMABOND ADVANCED (GAUZE/BANDAGES/DRESSINGS) ×2
DERMABOND ADVANCED .7 DNX12 (GAUZE/BANDAGES/DRESSINGS) ×1 IMPLANT
DRAPE LEGGINS SURG 28X43 STRL (DRAPES) ×3 IMPLANT
DRAPE UNDER BUTTOCK W/FLU (DRAPES) ×3 IMPLANT
ELECT REM PT RETURN 9FT ADLT (ELECTROSURGICAL) ×3
ELECTRODE REM PT RTRN 9FT ADLT (ELECTROSURGICAL) ×1 IMPLANT
GLOVE PI ORTHOPRO 6.5 (GLOVE) ×4
GLOVE PI ORTHOPRO STRL 6.5 (GLOVE) ×2 IMPLANT
GLOVE SURG SYN 6.5 ES PF (GLOVE) ×6 IMPLANT
GOWN STRL REUS W/ TWL LRG LVL3 (GOWN DISPOSABLE) ×2 IMPLANT
GOWN STRL REUS W/ TWL XL LVL3 (GOWN DISPOSABLE) IMPLANT
GOWN STRL REUS W/TWL LRG LVL3 (GOWN DISPOSABLE) ×4
GOWN STRL REUS W/TWL XL LVL3 (GOWN DISPOSABLE)
GRASPER SUT TROCAR 14GX15 (MISCELLANEOUS) ×3 IMPLANT
IRRIGATION STRYKERFLOW (MISCELLANEOUS) ×1 IMPLANT
IRRIGATOR STRYKERFLOW (MISCELLANEOUS) ×3
IV LACTATED RINGERS 1000ML (IV SOLUTION) ×3 IMPLANT
KIT PINK PAD W/HEAD ARE REST (MISCELLANEOUS)
KIT PINK PAD W/HEAD ARM REST (MISCELLANEOUS) IMPLANT
KIT TURNOVER CYSTO (KITS) ×3 IMPLANT
L-HOOK LAP DISP 36CM (ELECTROSURGICAL) ×3
LABEL OR SOLS (LABEL) IMPLANT
LHOOK LAP DISP 36CM (ELECTROSURGICAL) ×1 IMPLANT
LIGASURE VESSEL 5MM BLUNT TIP (ELECTROSURGICAL) IMPLANT
MANIPULATOR UTERINE 4.5 ZUMI (MISCELLANEOUS) ×3 IMPLANT
NS IRRIG 500ML POUR BTL (IV SOLUTION) ×3 IMPLANT
PACK LAP CHOLECYSTECTOMY (MISCELLANEOUS) ×3 IMPLANT
PAD OB MATERNITY 4.3X12.25 (PERSONAL CARE ITEMS) ×3 IMPLANT
PAD PREP 24X41 OB/GYN DISP (PERSONAL CARE ITEMS) ×3 IMPLANT
PENCIL ELECTRO HAND CTR (MISCELLANEOUS) ×3 IMPLANT
POUCH SPECIMEN RETRIEVAL 10MM (ENDOMECHANICALS) IMPLANT
SET TUBE SMOKE EVAC HIGH FLOW (TUBING) ×3 IMPLANT
SLEEVE ENDOPATH XCEL 5M (ENDOMECHANICALS) ×3 IMPLANT
SUT MNCRL 4-0 (SUTURE) ×2
SUT MNCRL 4-0 27XMFL (SUTURE) ×1
SUT MNCRL AB 4-0 PS2 18 (SUTURE) IMPLANT
SUT VICRYL 0 AB UR-6 (SUTURE) ×3 IMPLANT
SUTURE MNCRL 4-0 27XMF (SUTURE) ×1 IMPLANT
TROCAR ENDO BLADELESS 11MM (ENDOMECHANICALS) ×3 IMPLANT
TROCAR XCEL NON-BLD 5MMX100MML (ENDOMECHANICALS) ×3 IMPLANT

## 2019-03-07 NOTE — ED Notes (Signed)
Pt undressed, belongings placed in two bags and given to transport and family.

## 2019-03-07 NOTE — Anesthesia Preprocedure Evaluation (Signed)
Anesthesia Evaluation  Patient identified by MRN, date of birth, ID band Patient awake    Reviewed: Allergy & Precautions, H&P , NPO status , Patient's Chart, lab work & pertinent test results, reviewed documented beta blocker date and time   History of Anesthesia Complications Negative for: history of anesthetic complications  Airway Mallampati: III  TM Distance: >3 FB Neck ROM: full    Dental  (+) Dental Advidsory Given, Missing, Teeth Intact   Pulmonary neg shortness of breath, neg COPD, neg recent URI, Current Smoker,    Pulmonary exam normal        Cardiovascular Exercise Tolerance: Good negative cardio ROS Normal cardiovascular exam     Neuro/Psych PSYCHIATRIC DISORDERS Bipolar Disorder negative neurological ROS     GI/Hepatic Neg liver ROS, GERD  ,  Endo/Other  diabetesMorbid obesity  Renal/GU negative Renal ROS  negative genitourinary   Musculoskeletal   Abdominal   Peds  Hematology negative hematology ROS (+)   Anesthesia Other Findings Past Medical History: No date: Arthritis No date: Bipolar 1 disorder (HCC) No date: Diabetes mellitus   Reproductive/Obstetrics negative OB ROS                             Anesthesia Physical Anesthesia Plan  ASA: III and emergent  Anesthesia Plan: General   Post-op Pain Management:    Induction: Intravenous, Rapid sequence and Cricoid pressure planned  PONV Risk Score and Plan: 2 and Ondansetron, Dexamethasone, Midazolam, Scopolamine patch - Pre-op, Promethazine and Treatment may vary due to age or medical condition  Airway Management Planned: Oral ETT  Additional Equipment:   Intra-op Plan:   Post-operative Plan: Extubation in OR  Informed Consent: I have reviewed the patients History and Physical, chart, labs and discussed the procedure including the risks, benefits and alternatives for the proposed anesthesia with the patient  or authorized representative who has indicated his/her understanding and acceptance.     Dental Advisory Given  Plan Discussed with: Anesthesiologist, CRNA and Surgeon  Anesthesia Plan Comments:         Anesthesia Quick Evaluation

## 2019-03-07 NOTE — ED Notes (Signed)
Pt c/o increasing pain. MD made aware.

## 2019-03-07 NOTE — Anesthesia Procedure Notes (Deleted)
Anesthesia Procedure Note     

## 2019-03-07 NOTE — H&P (Signed)
Consult History and Physical   SERVICE: Gynecology   Patient Name: Tara Estes Patient MRN:   [id number]  CC: left sided pelvic pain  HPI: Tara Estes is a 29 y.o. G0 with sudden worsening of chronic left-sided pelvic pain  Location:  LLQ Onset/timing: chronic with worsening just today Duration: couple of years Quality:  Deep cramp, today sharp stabbing Severity: severe Aggravating or alleviating conditions: nothng makes better, worse with any movement Associated signs/symptoms: nausea and vomiting, decreased frequency of BMs and a struggle to go.  Context: patient has had chronic pelvic pain on left side exacerbated during menses, and only today became so severe she was doubled over and couldn't stand.  This prompted presentation to ED.  She normally has terrible periods and wastes vacation days every month due to them.   She has n/v with the pain but not between.     Review of Systems: positives in bold GEN:   fevers, chills, weight changes, appetite changes, fatigue, night sweats HEENT:  HA, vision changes, hearing loss, congestion, rhinorrhea, sinus pressure, dysphagia CV:   CP, palpitations PULM:  SOB, cough GI:  abd pain, N/V/D/C GU:  dysuria, urgency, frequency MSK:  arthralgias, myalgias, back pain, swelling SKIN:  rashes, color changes, pallor NEURO:  numbness, weakness, tingling, seizures, dizziness, tremors PSYCH:  depression, anxiety, behavioral problems, confusion  HEME/LYMPH:  easy bruising or bleeding ENDO:  heat/cold intolerance  Past Obstetrical History: OB History   No obstetric history on file.   never been pregnant  Past Gynecologic History: Patient's last menstrual period was 02/27/2019. Menstrual frequency monthly 5-6 days bleeding Very painful, entire time  Past Medical History: Past Medical History:  Diagnosis Date  . Arthritis   . Bipolar 1 disorder ([place])   . Diabetes mellitus     Past Surgical History:   Scalp laceration  repair   Family History:    Dads' side - dibaetes, dad has CHF, HTN,   Social History:  Social History   Occupational History  . Not on file  Tobacco Use  . Smoking status: Current Every Day Smoker    Types: Cigarettes  . Smokeless tobacco: Never Used  Substance and Sexual Activity  . Alcohol use: Yes  . Drug use: No  . Sexual activity: Never  <1/2 pack per day smoker no drugs   Home Medications:  Medications reconciled in EPIC  No current facility-administered medications on file prior to encounter.   No current outpatient medications on file prior to encounter.    Allergies:  Allergies  Allergen Reactions  . Other Anaphylaxis and Hives    CHILI POWDER    Physical Exam:  Temp:  [98.9 F (37.2 C)] 98.9 F (37.2 C) (02/19 1119) Pulse Rate:  [89-95] 95 (02/19 1630) Resp:  [17-35] 35 (02/19 1630) BP: (159-176)/(92-113) 159/92 (02/19 1630) SpO2:  [97 %-100 %] 100 % (02/19 1630) Weight:  [136.1 kg] 136.1 kg (02/19 1120)   General Appearance:  Well developed, well nourished, no acute distress, alert and oriented, cooperative and appears stated age 84:  Normocephalic atraumatic, extraocular movements intact, moist mucous membranes, neck supple with midline trachea and thyroid without masses Cardiovascular:  Normal S1/S2, regular rate and rhythm, no murmurs, 2+ distal pulses Pulmonary:  clear to auscultation, no wheezes, rales or rhonchi, symmetric air entry, good air exchange Abdomen:  Bowel sounds present, soft, nontender, nondistended, no abnormal masses or organomegaly, no epigastric pain Back: inspection of back is normal Extremities:  extremities normal, no tenderness, atraumatic, no cyanosis  or edema Skin:  normal coloration and turgor, no rashes, no suspicious skin lesions noted  Neurologic:  Cranial nerves 2-12 grossly intact, grossly equal strength and muscle tone, normal speech, no focal findings or movement disorder noted. Psychiatric:  Normal mood and  affect, appropriate, no AH/VH Pelvic:  Deferred until OR   Labs/Studies:   Results for orders placed or performed during the hospital encounter of 03/07/19 (from the past 24 hour(s))  Lipase, blood     Status: None   Collection Time: 03/07/19 11:26 AM  Result Value Ref Range   Lipase 21 11 - 51 U/L  Comprehensive metabolic panel     Status: Abnormal   Collection Time: 03/07/19 11:26 AM  Result Value Ref Range   Sodium 138 135 - 145 mmol/L   Potassium 3.2 (L) 3.5 - 5.1 mmol/L   Chloride 105 98 - 111 mmol/L   CO2 23 22 - 32 mmol/L   Glucose, Bld 106 (H) 70 - 99 mg/dL   BUN 9 6 - 20 mg/dL   Creatinine, Ser 0.67 0.44 - 1.00 mg/dL   Calcium 8.9 8.9 - 10.3 mg/dL   Total Protein 8.4 (H) 6.5 - 8.1 g/dL   Albumin 3.8 3.5 - 5.0 g/dL   AST 18 15 - 41 U/L   ALT 16 0 - 44 U/L   Alkaline Phosphatase 82 38 - 126 U/L   Total Bilirubin 0.3 0.3 - 1.2 mg/dL   GFR calc non Af Amer >60 >60 mL/min   GFR calc Af Amer >60 >60 mL/min   Anion gap 10 5 - 15  CBC     Status: Abnormal   Collection Time: 03/07/19 11:26 AM  Result Value Ref Range   WBC 8.9 4.0 - 10.5 K/uL   RBC 4.26 3.87 - 5.11 MIL/uL   Hemoglobin 8.9 (L) 12.0 - 15.0 g/dL   HCT 30.6 (L) 36.0 - 46.0 %   MCV 71.8 (L) 80.0 - 100.0 fL   MCH 20.9 (L) 26.0 - 34.0 pg   MCHC 29.1 (L) 30.0 - 36.0 g/dL   RDW 15.8 (H) 11.5 - 15.5 %   Platelets 543 (H) 150 - 400 K/uL   nRBC 0.0 0.0 - 0.2 %  Urinalysis, Complete w Microscopic     Status: Abnormal   Collection Time: 03/07/19 12:20 PM  Result Value Ref Range   Color, Urine YELLOW (A) YELLOW   APPearance HAZY (A) CLEAR   Specific Gravity, Urine 1.017 1.005 - 1.030   pH 7.0 5.0 - 8.0   Glucose, UA NEGATIVE NEGATIVE mg/dL   Hgb urine dipstick MODERATE (A) NEGATIVE   Bilirubin Urine NEGATIVE NEGATIVE   Ketones, ur 5 (A) NEGATIVE mg/dL   Protein, ur NEGATIVE NEGATIVE mg/dL   Nitrite NEGATIVE NEGATIVE   Leukocytes,Ua NEGATIVE NEGATIVE   RBC / HPF 6-10 0 - 5 RBC/hpf   WBC, UA 0-5 0 - 5 WBC/hpf    Bacteria, UA NONE SEEN NONE SEEN   Squamous Epithelial / LPF 0-5 0 - 5   Mucus PRESENT   Pregnancy, urine POC     Status: None   Collection Time: 03/07/19 12:31 PM  Result Value Ref Range   Preg Test, Ur NEGATIVE NEGATIVE    Imaging: CT ABDOMEN PELVIS W CONTRAST  Result Date: 03/07/2019 CLINICAL DATA:  Left flank and pelvic pain. EXAM: CT ABDOMEN AND PELVIS WITH CONTRAST TECHNIQUE: Multidetector CT imaging of the abdomen and pelvis was performed using the standard protocol following bolus administration of intravenous contrast. CONTRAST:  113mL OMNIPAQUE IOHEXOL 300 MG/ML  SOLN COMPARISON:  None. FINDINGS: Lower chest: The lung bases are clear. The heart is normal in size. No pericardial effusion. Hepatobiliary: No focal hepatic lesions or intrahepatic biliary dilatation. The gallbladder is normal. No common bile duct dilatation. Pancreas: No mass, inflammation or ductal dilatation. Spleen: Normal size. No focal lesions. Adrenals/Urinary Tract: Adrenal glands and kidneys are unremarkable. No renal, ureteral or bladder calculi or mass. Stomach/Bowel: The stomach, duodenum, small bowel and colon are grossly normal without oral contrast. No acute inflammatory changes, mass lesions or obstructive findings. The terminal ileum is normal. The appendix is normal. Vascular/Lymphatic: The aorta is normal in caliber. No dissection. The branch vessels are patent. The major venous structures are patent. No mesenteric or retroperitoneal mass or adenopathy. Small scattered lymph nodes are noted. Reproductive: The uterus is unremarkable. 4.8 x 3.7 cm slightly complex cyst associated with the right ovary. There appears to be a small amount of dependent hemorrhage. 8.4 x 6.8 cm complex lesion associated with the left ovary. There are septations and increased attenuation which could suggest hemorrhage. If there is any clinical concern for ovarian torsion I would recommend a pelvic ultrasound with Doppler examination.  Other: No pelvic adenopathy. No free pelvic fluid collections. No inguinal mass or adenopathy. No abdominal wall hernia or subcutaneous lesions. Musculoskeletal: No significant bony findings. IMPRESSION: 1. 8.4 x 6.8 cm complex cystic lesion associated with the left ovary. There are septations and increased attenuation suggesting hemorrhage. If there is any clinical concern for ovarian torsion I would recommend a pelvic ultrasound with Doppler examination. 2. 4.8 x 3.7 cm slightly complex cyst associated with the right ovary. 3. No acute abdominal findings or adenopathy. Electronically Signed   By: Marijo Sanes M.D.   On: 03/07/2019 13:36   US PELVIC COMPLETE W TRANSVAGINAL AND TORSION R/O  Result Date: 03/07/2019 CLINICAL DATA:  29 year old female with pelvic pain. EXAM: TRANSABDOMINAL AND TRANSVAGINAL ULTRASOUND OF PELVIS DOPPLER ULTRASOUND OF OVARIES TECHNIQUE: Both transabdominal and transvaginal ultrasound examinations of the pelvis were performed. Transabdominal technique was performed for global imaging of the pelvis including uterus, ovaries, adnexal regions, and pelvic cul-de-sac. It was necessary to proceed with endovaginal exam following the transabdominal exam to visualize the endometrium and ovaries. Color and duplex Doppler ultrasound was utilized to evaluate blood flow to the ovaries. COMPARISON:  CT abdomen pelvis dated 03/07/2019. FINDINGS: Uterus Measurements: 11.8 x 5.0 x 4.2 cm = volume: 131 mL. The uterus is anteverted and heterogeneous. There is a 2.3 x 2.7 x 3.0 cm fundal subserosal or partially exophytic fibroid and a 3.0 x 3.1 x 2.6 cm right posterior body intramural fibroid. There is apparent associated mild mass effect by the posterior fibroid with mild displacement of the lower endometrium anteriorly. Endometrium Thickness: 7 mm.  No focal abnormality visualized. Right ovary Measurements: 7.1 x 5.6 x 6.1 cm = volume: 12.6 mL. There is a 4.7 x 5.1 x 4.1 cm complex cystic lesion with  uniform echogenic content which may represent a hemorrhagic cyst versus an endometrioma. No internal vascularity identified within this structure. Doppler images demonstrate flow within the ovarian tissue. Left ovary Measurements: 9.5 x 6.0 x 7.5 cm = volume: 22.4 mL. There is a complex cystic structure with cuneiform echogenic content in the left ovary which may represent a hemorrhagic cyst or an endometrioma. No internal vascularity is noted within this structure. There is flow in the ovarian tissue. Pulsed Doppler evaluation of both ovaries demonstrates normal low-resistance arterial and venous  waveforms. Other findings No abnormal free fluid. IMPRESSION: 1. Bilateral ovarian hemorrhagic cyst versus endometrioma. Follow-up with ultrasound in 8 weeks recommended. 2. Doppler detected flow to both ovaries. 3. Heterogeneous uterus with two fibroids. Electronically Signed   By: Anner Crete M.D.   On: 03/07/2019 15:17       Assessment / Plan:   KENRA ESSELMAN is a 29 y.o. G0 who presents with acute worsening of left sided pelvic pain and concern for ovarian torsion with clinical picture.  1.  Adnexal masses, could be one sided or both sided.  The way the imaging appears and the clinical picture I have a concern for ovarian torsion.  This was explained to the patient and she is willing to go to surgery for intervention.  She has been NPO for the entire day.  -OR has been notified, -Covid Negative -BP cuff changed and appropriate cuff size and position and now BPs are normal for her, not severe hypertensive.   -Abx ordered -To OR when available.  Anesthesia currently involved in a code in the ED    Thank you for the opportunity to be involved with this patient's care.  ----- Larey Days, MD Attending Obstetrician and Gynecologist [facility], Department of [facility]

## 2019-03-07 NOTE — ED Provider Notes (Signed)
[facility] Emergency Department Provider Note    First MD Initiated Contact with Patient 03/07/19 1149     (approximate)  I have reviewed the triage vital signs and the nursing notes.   HISTORY  Chief Complaint Back Pain and Abdominal Pain    HPI Tara Estes is a 29 y.o. female the below listed past medical history presents to the ER for evaluation of left-sided flank and abdominal pain now radiating around to the periumbilical and suprapubic region.  Is never had pain like this before.  This had a few episodes of mild discomfort earlier this week but it became more severe today.  No measured fevers.  Has been having some dysuria and has been taking cranberry pills and drinking cranberry juice.  Denies any chest pain or shortness of breath.  No vaginal discharge.  No bleeding.  Last menstrual cycle was 1 week ago.  States that there is no chance that she is pregnant.    Past Medical History:  Diagnosis Date  . Arthritis   . Bipolar 1 disorder ([place])   . Diabetes mellitus    History reviewed. No pertinent family history. History reviewed. No pertinent surgical history. There are no problems to display for this patient.     Prior to Admission medications   Medication Sig Start Date End Date Taking? Authorizing Provider  oseltamivir (TAMIFLU) 75 MG capsule Take 1 capsule (75 mg total) by mouth 2 (two) times daily. 02/13/18   Versie Starks, PA-C    Allergies Other    Social History Social History   Tobacco Use  . Smoking status: Current Every Day Smoker    Types: Cigarettes  . Smokeless tobacco: Never Used  Substance Use Topics  . Alcohol use: Yes  . Drug use: No    Review of Systems Patient denies headaches, rhinorrhea, blurry vision, numbness, shortness of breath, chest pain, edema, cough, abdominal pain, nausea, vomiting, diarrhea, dysuria, fevers, rashes or hallucinations unless otherwise stated above in  HPI. ____________________________________________   PHYSICAL EXAM:  VITAL SIGNS: Vitals:   03/07/19 1600 03/07/19 1630  BP: (!) 176/106 (!) 159/92  Pulse: 94 95  Resp: 17 (!) 35  Temp:    SpO2: 97% 100%    Constitutional: Alert and oriented.  Eyes: Conjunctivae are normal.  Head: Atraumatic. Nose: No congestion/rhinnorhea. Mouth/Throat: Mucous membranes are moist.   Neck: No stridor. Painless ROM.  Cardiovascular: Normal rate, regular rhythm. Grossly normal heart sounds.  Good peripheral circulation. Respiratory: Normal respiratory effort.  No retractions. Lungs CTAB. Gastrointestinal: Soft with mild ttp in llq No distention. No abdominal bruits. No CVA tenderness. Genitourinary:  Musculoskeletal: No lower extremity tenderness nor edema.  No joint effusions. Neurologic:  Normal speech and language. No gross focal neurologic deficits are appreciated. No facial droop Skin:  Skin is warm, dry and intact. No rash noted. Psychiatric: Mood and affect are normal. Speech and behavior are normal.  ____________________________________________   LABS (all labs ordered are listed, but only abnormal results are displayed)  Results for orders placed or performed during the hospital encounter of 03/07/19 (from the past 24 hour(s))  Lipase, blood     Status: None   Collection Time: 03/07/19 11:26 AM  Result Value Ref Range   Lipase 21 11 - 51 U/L  Comprehensive metabolic panel     Status: Abnormal   Collection Time: 03/07/19 11:26 AM  Result Value Ref Range   Sodium 138 135 - 145 mmol/L   Potassium 3.2 (L)  3.5 - 5.1 mmol/L   Chloride 105 98 - 111 mmol/L   CO2 23 22 - 32 mmol/L   Glucose, Bld 106 (H) 70 - 99 mg/dL   BUN 9 6 - 20 mg/dL   Creatinine, Ser 0.67 0.44 - 1.00 mg/dL   Calcium 8.9 8.9 - 10.3 mg/dL   Total Protein 8.4 (H) 6.5 - 8.1 g/dL   Albumin 3.8 3.5 - 5.0 g/dL   AST 18 15 - 41 U/L   ALT 16 0 - 44 U/L   Alkaline Phosphatase 82 38 - 126 U/L   Total Bilirubin 0.3 0.3  - 1.2 mg/dL   GFR calc non Af Amer >60 >60 mL/min   GFR calc Af Amer >60 >60 mL/min   Anion gap 10 5 - 15  CBC     Status: Abnormal   Collection Time: 03/07/19 11:26 AM  Result Value Ref Range   WBC 8.9 4.0 - 10.5 K/uL   RBC 4.26 3.87 - 5.11 MIL/uL   Hemoglobin 8.9 (L) 12.0 - 15.0 g/dL   HCT 30.6 (L) 36.0 - 46.0 %   MCV 71.8 (L) 80.0 - 100.0 fL   MCH 20.9 (L) 26.0 - 34.0 pg   MCHC 29.1 (L) 30.0 - 36.0 g/dL   RDW 15.8 (H) 11.5 - 15.5 %   Platelets 543 (H) 150 - 400 K/uL   nRBC 0.0 0.0 - 0.2 %  Urinalysis, Complete w Microscopic     Status: Abnormal   Collection Time: 03/07/19 12:20 PM  Result Value Ref Range   Color, Urine YELLOW (A) YELLOW   APPearance HAZY (A) CLEAR   Specific Gravity, Urine 1.017 1.005 - 1.030   pH 7.0 5.0 - 8.0   Glucose, UA NEGATIVE NEGATIVE mg/dL   Hgb urine dipstick MODERATE (A) NEGATIVE   Bilirubin Urine NEGATIVE NEGATIVE   Ketones, ur 5 (A) NEGATIVE mg/dL   Protein, ur NEGATIVE NEGATIVE mg/dL   Nitrite NEGATIVE NEGATIVE   Leukocytes,Ua NEGATIVE NEGATIVE   RBC / HPF 6-10 0 - 5 RBC/hpf   WBC, UA 0-5 0 - 5 WBC/hpf   Bacteria, UA NONE SEEN NONE SEEN   Squamous Epithelial / LPF 0-5 0 - 5   Mucus PRESENT   Pregnancy, urine POC     Status: None   Collection Time: 03/07/19 12:31 PM  Result Value Ref Range   Preg Test, Ur NEGATIVE NEGATIVE   ____________________________________________ ____________________________________________  RADIOLOGY  I personally reviewed all radiographic images ordered to evaluate for the above acute complaints and reviewed radiology reports and findings.  These findings were personally discussed with the patient.  Please see medical record for radiology report.  ____________________________________________   PROCEDURES  Procedure(s) performed:  Procedures    Critical Care performed: no ____________________________________________   INITIAL IMPRESSION / ASSESSMENT AND PLAN / ED COURSE  Pertinent labs & imaging  results that were available during my care of the patient were reviewed by me and considered in my medical decision making (see chart for details).   DDX: colitis, stone, cystitis, pyelo, cyst, torsion  Tara Estes is a 29 y.o. who presents to the ED with pain as described above.  Imaging and blood work be ordered for above differential.  Will provide IV pain medication.  Clinical Course as of Mar 06 1657  Fri Mar 07, 2019  1413 Patient still with some pain therefore will order ultrasound to further evaluate.   [PR]  1612 Will discuss with OB/GYN.   [PR]    Clinical  Course User Index [PR] Merlyn Lot, MD   Case discussed with Dr. Leonides Schanz of OB/GYN who kindly agrees to evaluate patient at bedside..  Have discussed with the patient and available family all diagnostics and treatments performed thus far and all questions were answered to the best of my ability. The patient demonstrates understanding and agreement with plan.   The patient was evaluated in Emergency Department today for the symptoms described in the history of present illness. He/she was evaluated in the context of the global COVID-19 pandemic, which necessitated consideration that the patient might be at risk for infection with the SARS-CoV-2 virus that causes COVID-19. Institutional protocols and algorithms that pertain to the evaluation of patients at risk for COVID-19 are in a state of rapid change based on information released by regulatory bodies including the CDC and federal and state organizations. These policies and algorithms were followed during the patient's care in the ED.  As part of my medical decision making, I reviewed the following data within the [place] notes reviewed and incorporated, Labs reviewed, notes from prior ED visits and [place] Controlled Substance Database   ____________________________________________   FINAL CLINICAL IMPRESSION(S) / ED DIAGNOSES  Final diagnoses:   Ovarian cyst  Cysts of both ovaries      NEW MEDICATIONS STARTED DURING THIS VISIT:  New Prescriptions   No medications on file     Note:  This document was prepared using Dragon voice recognition software and may include unintentional dictation errors.    Merlyn Lot, MD 03/07/19 1659

## 2019-03-07 NOTE — Discharge Instructions (Signed)
Discharge instructions:  Call office if you have any of the following: fever >101 F, chills, shortness of breath, excessive vaginal bleeding, incision drainage or problems, leg pain or redness, or any other concerns.   Activity: Do not lift > 20 lbs for 2 weeks.   No driving while on narcotics and until you are certain you can slam on the brakes.   You may feel some pain in your upper right abdomen/rib and right shoulder.  This is from the gas in the abdomen for surgery. This will subside over time, please be patient!  Take 600mg  Ibuprofen and 1000mg  Tylenol together, around the clock, every 6 hours for at least the first 3-5 days.  After this you can take as needed.  This will help decrease inflammation and promote healing.  The narcotics you'll take just as needed, as they just trick your brain into thinking its not in pain.    Please don't limit yourself in terms of routine activity.  You will be able to do most things, although they may take longer to do or be a little painful.  You can do it!  Don't be a hero, but don't be a wimp either!   Please start taking the norethindrone 5mg  daily.  This will hopefully help elminate your pain and bleeding from your period until we can do your surgery.       AMBULATORY SURGERY  DISCHARGE INSTRUCTIONS   1) The drugs that you were given will stay in your system until tomorrow so for the next 24 hours you should not:  A) Drive an automobile B) Make any legal decisions C) Drink any alcoholic beverage   2) You may resume regular meals tomorrow.  Today it is better to start with liquids and gradually work up to solid foods.  You may eat anything you prefer, but it is better to start with liquids, then soup and crackers, and gradually work up to solid foods.   3) Please notify your doctor immediately if you have any unusual bleeding, trouble breathing, redness and pain at the surgery site, drainage, fever, or pain not relieved by  medication.    4) Additional Instructions:        Please contact your physician with any problems or Same Day Surgery at [phone], Monday through Friday 6 am to 4 pm, or [place] at [facility] number at [phone].

## 2019-03-07 NOTE — Transfer of Care (Signed)
Immediate Anesthesia Transfer of Care Note  Patient: Tara Estes  Procedure(s) Performed: LAPAROSCOPY DIAGNOSTIC, lysis of adhesions (N/A Abdomen)  Patient Location: PACU  Anesthesia Type:General  Level of Consciousness: awake, alert  and oriented  Airway & Oxygen Therapy: Patient connected to face mask oxygen  Post-op Assessment: Report given to RN and Post -op Vital signs reviewed and stable  Post vital signs: Reviewed and stable  Last Vitals:  Vitals Value Taken Time  BP    Temp    Pulse 100 03/07/19 2221  Resp 26 03/07/19 2221  SpO2 100 % 03/07/19 2221  Vitals shown include unvalidated device data.  Last Pain:  Vitals:   03/07/19 1805  TempSrc:   PainSc: 3          Complications: No apparent anesthesia complications

## 2019-03-07 NOTE — Op Note (Signed)
Tara Estes PROCEDURE DATE: 03/07/2019  PATIENT:  Tara Estes  29 y.o. female  PRE-OPERATIVE DIAGNOSIS:  POSSIBLE OVARIAN TORSION  POST-OPERATIVE DIAGNOSIS:  stage 4 endometriosis, enlarged fibroid uterus, bilateral adnexal masses, frozen pelvis  PROCEDURE:  Procedure(s): LAPAROSCOPY DIAGNOSTIC, lysis of adhesions (N/A)  SURGEON:  Surgeon(s) and Role:    * Keston Seever, Tara Loh, MD - Primary  ANESTHESIA:  General via ET  I/O  Total I/O In: - see flowsheet Out: 28 [Urine:50; Blood:2]    FINDINGS:   Normal upper abdomen Enlarged uterus, bilateral ovaries, fallopian tubes, sigmoid colon all adherent to each other, with ovaries filling the posterior culdesac and elevating the uterus out of its normal anatomic position.   There is scant dark fluid in the bilateral paracolic gutters. There are no normal surgical planes in the pelvis.  SPECIMEN: none  COMPLICATIONS: none apparent  DISPOSITION: vital signs stable to PACU   Indication for Surgery: 29 y.o. G0 who presented to the ED with acute exacerbation of chronic pelvic pain.  She had imaging that showed enlarged ovary with internal echos consistent with hemorrhage and concerning for ovarian torsion.    Risks of surgery were discussed with the patient including but not limited to: bleeding which may require transfusion or reoperation; infection which may require antibiotics; injury to bowel, bladder, ureters or other surrounding organs; need for additional procedures including laparotomy, blood clot, incisional problems and other postoperative/anesthesia complications. Written informed consent was obtained.     PROCEDURE IN DETAIL:  The patient had sequential compression devices applied to her lower extremities while in the preoperative area.  She was then taken to the operating room where general anesthesia was administered and was found to be adequate.  She was placed in the dorsal lithotomy position, and was prepped and draped  in a sterile manner.  A Foley catheter was inserted into her bladder and attached to constant drainage and a uterine manipulator was then advanced into the uterus .  After a surgical timeout was performed, attention was turned to the abdomen where an umbilical incision was made with the scalpel. A 69mm trochar was inserted in the umbilical incision using a visiport method. Opening pressure was 29mmHg, and the abdomen was insufflated to 15mg Hg carbon dioxide gas and adequate pneumoperitoneum was obtained.  A survey of the patient's pelvis and abdomen revealed the findings as mentioned above. One 35mm port was inserted in the lower left quadrant under visualization.    The pelvis was unable to be evaluated due to the dense adhesions of the organs within.  An attempt was made to dissect some of the adhesions, however quickly it was evident that to be effective treatment, this procedure needed to be done in a carefully planned manner, not emergently overnight and without an adequately trained assistant.   Thus, further attempts to identify and treat the cause of her acute pain was abandoned.   The operative site was surveyed, and it was found to be hemostatic.  No intraoperative injury to surrounding organs was noted.  The abdomen was desufflated and all instruments were then removed from the patient's abdomen. The uterine manipulator was removed without complications.  All skin incisions were closed with 4-0 monocryl and covered with surgical glue. The patient tolerated the procedures well.  All instruments, needles, and sponge counts were correct x 2. The patient was taken to the recovery room in stable condition.   ---- Larey Days, MD Attending Obstetrician and [facility]

## 2019-03-07 NOTE — Anesthesia Procedure Notes (Signed)
Procedure Name: Intubation Performed by: Pj Zehner L, CRNA Pre-anesthesia Checklist: Patient identified, Patient being monitored, Timeout performed, Emergency Drugs available and Suction available Patient Re-evaluated:Patient Re-evaluated prior to induction Oxygen Delivery Method: Circle system utilized Preoxygenation: Pre-oxygenation with 100% oxygen Induction Type: IV induction Ventilation: Mask ventilation without difficulty Laryngoscope Size: Mac and 3 Grade View: Grade I Tube type: Oral Tube size: 7.0 mm Number of attempts: 1 Airway Equipment and Method: Stylet Placement Confirmation: ETT inserted through vocal cords under direct vision,  positive ETCO2 and breath sounds checked- equal and bilateral Secured at: 21 cm Tube secured with: Tape Dental Injury: Teeth and Oropharynx as per pre-operative assessment        

## 2019-03-07 NOTE — ED Notes (Signed)
MD gave this RN consent to give morphine PRN at this time.

## 2019-03-07 NOTE — ED Triage Notes (Signed)
Pt presents to ED c/o lower back pain and lower abd pain x1 week. Pt tearful in triage. Denies urinary s/sx.

## 2019-03-07 NOTE — ED Notes (Signed)
Pt transported to CT ?

## 2019-03-08 NOTE — Anesthesia Postprocedure Evaluation (Signed)
Anesthesia Post Note  Patient: Tara Estes  Procedure(s) Performed: LAPAROSCOPY DIAGNOSTIC, lysis of adhesions (N/A Abdomen)  Patient location during evaluation: PACU Anesthesia Type: General Level of consciousness: awake and alert Pain management: pain level controlled Vital Signs Assessment: post-procedure vital signs reviewed and stable Respiratory status: spontaneous breathing, nonlabored ventilation, respiratory function stable and patient connected to nasal cannula oxygen Cardiovascular status: blood pressure returned to baseline and stable Postop Assessment: no apparent nausea or vomiting Anesthetic complications: no     Last Vitals:  Vitals:   03/07/19 2304 03/07/19 2333  BP: (!) 141/80 125/76  Pulse: 78 84  Resp: 16 18  Temp: 36.6 C   SpO2: 99% 100%    Last Pain:  Vitals:   03/07/19 2333  TempSrc:   PainSc: 2                  Martha Clan

## 2019-03-10 LAB — GLUCOSE, CAPILLARY: Glucose-Capillary: 108 mg/dL — ABNORMAL HIGH (ref 70–99)

## 2019-03-15 ENCOUNTER — Emergency Department
Admission: EM | Admit: 2019-03-15 | Discharge: 2019-03-15 | Disposition: A | Discharge status: Home / Self Care | Payer: Self-pay | Attending: Emergency Medicine | Admitting: Emergency Medicine

## 2019-03-15 ENCOUNTER — Other Ambulatory Visit: Payer: Self-pay

## 2019-03-15 ENCOUNTER — Encounter: Payer: Self-pay | Admitting: Emergency Medicine

## 2019-03-15 DIAGNOSIS — E119 Type 2 diabetes mellitus without complications: Secondary | ICD-10-CM | POA: Insufficient documentation

## 2019-03-15 DIAGNOSIS — Z79899 Other long term (current) drug therapy: Secondary | ICD-10-CM | POA: Insufficient documentation

## 2019-03-15 DIAGNOSIS — N939 Abnormal uterine and vaginal bleeding, unspecified: Secondary | ICD-10-CM

## 2019-03-15 DIAGNOSIS — F17210 Nicotine dependence, cigarettes, uncomplicated: Secondary | ICD-10-CM | POA: Insufficient documentation

## 2019-03-15 LAB — CBC
HCT: 31.6 % — ABNORMAL LOW (ref 36.0–46.0)
Hemoglobin: 8.9 g/dL — ABNORMAL LOW (ref 12.0–15.0)
MCH: 20.3 pg — ABNORMAL LOW (ref 26.0–34.0)
MCHC: 28.2 g/dL — ABNORMAL LOW (ref 30.0–36.0)
MCV: 72 fL — ABNORMAL LOW (ref 80.0–100.0)
Platelets: 504 10*3/uL — ABNORMAL HIGH (ref 150–400)
RBC: 4.39 MIL/uL (ref 3.87–5.11)
RDW: 15.8 % — ABNORMAL HIGH (ref 11.5–15.5)
WBC: 7.8 10*3/uL (ref 4.0–10.5)
nRBC: 0 % (ref 0.0–0.2)

## 2019-03-15 LAB — BASIC METABOLIC PANEL
Anion gap: 10 (ref 5–15)
BUN: 11 mg/dL (ref 6–20)
CO2: 22 mmol/L (ref 22–32)
Calcium: 8.9 mg/dL (ref 8.9–10.3)
Chloride: 105 mmol/L (ref 98–111)
Creatinine, Ser: 0.78 mg/dL (ref 0.44–1.00)
GFR calc Af Amer: >60 mL/min (ref >60)
GFR calc non Af Amer: >60 mL/min (ref >60)
Glucose, Bld: 100 mg/dL — ABNORMAL HIGH (ref 70–99)
Potassium: 3.6 mmol/L (ref 3.5–5.1)
Sodium: 137 mmol/L (ref 135–145)

## 2019-03-15 LAB — TYPE AND SCREEN
ABO/RH(D): B POS
Antibody Screen: NEGATIVE

## 2019-03-15 MED ORDER — OXYCODONE HCL 5 MG PO TABS: 5.0000 mg | ORAL_TABLET | Freq: Four times a day (QID) | ORAL | 0 refills | Status: DC | PRN

## 2019-03-15 MED ORDER — ONDANSETRON HCL 4 MG/2ML IJ SOLN
4.0000 mg | Freq: Once | INTRAMUSCULAR | Status: AC
  Administered 2019-03-15: 14:00:00 4 mg via INTRAVENOUS
  Filled 2019-03-15: qty 2

## 2019-03-15 MED ORDER — MORPHINE SULFATE (PF) 2 MG/ML IV SOLN
2.0000 mg | Freq: Once | INTRAVENOUS | Status: AC
  Administered 2019-03-15: 2 mg via INTRAVENOUS
  Filled 2019-03-15: qty 1

## 2019-03-15 NOTE — ED Provider Notes (Signed)
[facility] Emergency Department Provider Note   ____________________________________________    I have reviewed the triage vital signs and the nursing notes.   HISTORY  Chief Complaint Vaginal Bleeding     HPI Tara Estes is a 29 y.o. female with a history of bipolar disorder, diabetes who presents with complaints of vaginal bleeding and lower abdominal pain.  Patient recently had laparoscopic procedure with Dr. Leonides Schanz after similar pain.  Found to have "frozen pelvis "per op note and patient is now scheduled for hysterectomy.  Started on progestin.  Past Medical History:  Diagnosis Date  . Arthritis   . Bipolar 1 disorder ([place])   . Diabetes mellitus     Patient Active Problem List   Diagnosis Date Noted  . S/P laparoscopic surgery 03/07/2019    Past Surgical History:  Procedure Laterality Date  . LAPAROSCOPY N/A 03/07/2019   Procedure: LAPAROSCOPY DIAGNOSTIC, lysis of adhesions;  Surgeon: Ward, Honor Loh, MD;  Location: [facility] ORS;  Service: Gynecology;  Laterality: N/A;    Prior to Admission medications   Medication Sig Start Date End Date Taking? Authorizing Provider  ibuprofen (ADVIL) 600 MG tablet Take 1 tablet (600 mg total) by mouth every 6 (six) hours. 03/07/19  Yes Ward, Honor Loh, MD  norethindrone (AYGESTIN) 5 MG tablet Take 1 tablet (5 mg total) by mouth daily. 03/07/19  Yes Ward, Honor Loh, MD  oxyCODONE (ROXICODONE) 5 MG immediate release tablet Take 1 tablet (5 mg total) by mouth every 6 (six) hours as needed for severe pain. 03/15/19 03/14/20  Lavonia Drafts, MD     Allergies Other  No family history on file.  Social History Social History   Tobacco Use  . Smoking status: Current Every Day Smoker    Types: Cigarettes  . Smokeless tobacco: Never Used  Substance Use Topics  . Alcohol use: Yes  . Drug use: No    Review of Systems  Constitutional: No fever/chills Eyes: No visual changes.  ENT: No sore throat.  Cardiovascular: Denies chest pain. Respiratory: Denies shortness of breath. Gastrointestinal: As above Genitourinary: As above Musculoskeletal: Negative for back pain. Skin: Negative for rash. Neurological: Negative for headaches or weakness   ____________________________________________   PHYSICAL EXAM:  VITAL SIGNS: ED Triage Vitals  Enc Vitals Group     BP 03/15/19 1142 (!) 158/96     Pulse Rate 03/15/19 1142 91     Resp 03/15/19 1142 18     Temp 03/15/19 1142 98.8 F (37.1 C)     Temp Source 03/15/19 1142 Oral     SpO2 03/15/19 1142 100 %     Weight 03/15/19 1143 136.1 kg (300 lb)     Height 03/15/19 1143 1.702 m (5\' 7" )     Head Circumference --      Peak Flow --      Pain Score 03/15/19 1142 8     Pain Loc --      Pain Edu? --      Excl. in [place]? --     Constitutional: Alert and oriented.  Eyes: Conjunctivae are normal.   Mouth/Throat: Mucous membranes are moist.   Neck:  Painless ROM Cardiovascular: Normal rate, regular rhythm. Grossly normal heart sounds.  Good peripheral circulation. Respiratory: Normal respiratory effort.  No retractions. Lungs CTAB. Gastrointestinal: Soft and nontender. No distention.  Musculoskeletal: .  Warm and well perfused Neurologic:  Normal speech and language. No gross focal neurologic deficits are appreciated.  Skin:  Skin is  warm, dry and intact. No rash noted. Psychiatric: Mood and affect are normal. Speech and behavior are normal.  ____________________________________________   LABS (all labs ordered are listed, but only abnormal results are displayed)  Labs Reviewed  CBC - Abnormal; Notable for the following components:      Result Value   Hemoglobin 8.9 (*)    HCT 31.6 (*)    MCV 72.0 (*)    MCH 20.3 (*)    MCHC 28.2 (*)    RDW 15.8 (*)    Platelets 504 (*)    All other components within normal limits  BASIC METABOLIC PANEL - Abnormal; Notable for the following components:   Glucose, Bld 100 (*)    All other  components within normal limits  POC URINE PREG, ED  TYPE AND SCREEN   ____________________________________________  EKG  None ____________________________________________  RADIOLOGY  None ____________________________________________   PROCEDURES  Procedure(s) performed: No  Procedures   Critical Care performed: No ____________________________________________   INITIAL IMPRESSION / ASSESSMENT AND PLAN / ED COURSE  Pertinent labs & imaging results that were available during my care of the patient were reviewed by me and considered in my medical decision making (see chart for details).  Patient's hemoglobin is stable from prior.  Discussed case with Dr. Leonides Schanz, who recommends increasing progestin to 15 mg daily and no need for imaging at this time as she has hysterectomy scheduled.  Lab work is otherwise reassuring.  Will refill patient's oxycodone as well for pain control.  Return precautions discussed, outpatient follow-up    ____________________________________________   FINAL CLINICAL IMPRESSION(S) / ED DIAGNOSES  Final diagnoses:  Vaginal bleeding        Note:  This document was prepared using Dragon voice recognition software and may include unintentional dictation errors.   Lavonia Drafts, MD 03/15/19 1536

## 2019-03-15 NOTE — ED Notes (Signed)
First Nurse Note: Pt to ED via POV c/o vaginal bleeding and pain. Pt states that she had surgery last Friday. Pt started having vaginal bleeding last last, pt states that it has gotten heavier overnight. Pt is in NAD.

## 2019-03-15 NOTE — ED Triage Notes (Signed)
Pt arrived via POV with reports of vaginal bleeding that started late last night. Pt seen here last week and states she had an emergency surgery to look at her ovaries.   Pt c/o sharp vaginal pains and states she is soaking through a pad every 1-1.5 hours with clots.

## 2019-03-15 NOTE — ED Notes (Signed)
Pt took pain medication at 0925, oxycodone, ibuprofen and another medication

## 2019-03-21 ENCOUNTER — Other Ambulatory Visit: Payer: Self-pay | Admitting: Radiology

## 2019-03-28 ENCOUNTER — Encounter
Admission: RE | Admit: 2019-03-28 | Discharge: 2019-03-28 | Disposition: A | Discharge status: Home / Self Care | Payer: Self-pay | Source: Ambulatory Visit | Attending: Obstetrics & Gynecology | Admitting: Obstetrics & Gynecology

## 2019-03-28 ENCOUNTER — Other Ambulatory Visit: Payer: Self-pay

## 2019-03-28 DIAGNOSIS — Z01818 Encounter for other preprocedural examination: Secondary | ICD-10-CM | POA: Insufficient documentation

## 2019-03-28 HISTORY — DX: Headache, unspecified: R51.9

## 2019-03-28 HISTORY — DX: Gastro-esophageal reflux disease without esophagitis: K21.9

## 2019-03-28 HISTORY — DX: Endometriosis, unspecified: N80.9

## 2019-03-28 NOTE — Pre-Procedure Instructions (Signed)
Patient will have EKG on 3/15.  Instructed to come through [place] then she will get her Covid test after.

## 2019-03-28 NOTE — Pre-Procedure Instructions (Signed)
No need to repeat CBC Met B per Dr. Leonides Schanz.  Per secure chat.

## 2019-03-28 NOTE — Patient Instructions (Addendum)
Your procedure is scheduled on Wed. 3/17 Report to Day Surgery.  [facility] To find out your arrival time please call [phone] between 1PM - 3PM on Tues 3/16.  Remember: Instructions that are not followed completely may result in serious medical risk,  up to and including death, or upon the discretion of your surgeon and anesthesiologist your  surgery may need to be rescheduled.     _X__ 1. Do not eat food after midnight the night before your procedure.                 No gum chewing or hard candies. You may drink clear liquids up to 2 hours                 before you are scheduled to arrive for your surgery- DO not drink clear                 liquids within 2 hours of the start of your surgery.                 Clear Liquids include:  water,  Gatorade, G2 or                   Black Coffee or Tea (Do not add                 anything to coffee or tea). __x___2.   Complete the carbohydrate drink provided to you, 2 hours before arrival.  __X__2.  On the morning of surgery brush your teeth with toothpaste and water, you                may rinse your mouth with mouthwash if you wish.  Do not swallow any toothpaste of mouthwash.     _X__ 3.  No Alcohol for 24 hours before or after surgery.   _X__ 4.  Do Not Smoke or use e-cigarettes For 24 Hours Prior to Your Surgery.                 Do not use any chewable tobacco products for at least 6 hours prior to                 surgery.  ____  5.  Bring all medications with you on the day of surgery if instructed.   __x__  6.  Notify your doctor if there is any change in your medical condition      (cold, fever, infections).     Do not wear jewelry, make-up, hairpins, clips or nail polish. Do not wear lotions, powders, or perfumes. You may wear deodorant. Do not shave 48 hours prior to surgery. Men may shave face and neck. Do not bring valuables to the hospital.    [facility] is not responsible for any belongings  or valuables.  Contacts, dentures or bridgework may not be worn into surgery. Leave your suitcase in the car. After surgery it may be brought to your room. For patients admitted to the hospital, discharge time is determined by your treatment team.   Patients discharged the day of surgery will not be allowed to drive home.   Make arrangements for someone to be with you for the first 24 hours of your Same Day Discharge.    Please read over the following fact sheets that you were given:    __x__ Take these medicines the morning of surgery with A SIP OF WATER:    1. oxyCODONE (ROXICODONE) 5  MG immediate release tablet   If needed  2.   3.   4.  5.  6.  ____ Fleet Enema (as directed)   _x___ Use CHG Soap (or wipes) as directed  ____ Use Benzoyl Peroxide Gel as instructed  ____ Use inhalers on the day of surgery  ____ Stop metformin 2 days prior to surgery    ____ Take 1/2 of usual insulin dose the night before surgery. No insulin the morning          of surgery.   ____ Stop Coumadin/Plavix/aspirin on   __x__ Stop Anti-inflammatories ibuprofen (ADVIL) 600 MG tablet aleve or aspirin today  May take tylenol   ____ Stop supplements until after surgery.    ____ Bring C-Pap to the hospital.   Get stool softener/ laxative and take twice a day for constipation

## 2019-03-28 NOTE — Pre-Procedure Instructions (Signed)
Secure chat to Dr. Leonides Schanz to ask if we need to repeat CBC Met B since she had one on 03/15/19 in ER.

## 2019-03-31 ENCOUNTER — Other Ambulatory Visit: Payer: Self-pay

## 2019-03-31 ENCOUNTER — Encounter
Admission: RE | Admit: 2019-03-31 | Discharge: 2019-03-31 | Disposition: A | Discharge status: Home / Self Care | Payer: Self-pay | Source: Ambulatory Visit | Attending: Obstetrics & Gynecology | Admitting: Obstetrics & Gynecology

## 2019-03-31 DIAGNOSIS — Z01818 Encounter for other preprocedural examination: Secondary | ICD-10-CM | POA: Insufficient documentation

## 2019-03-31 DIAGNOSIS — E669 Obesity, unspecified: Secondary | ICD-10-CM | POA: Insufficient documentation

## 2019-03-31 DIAGNOSIS — E118 Type 2 diabetes mellitus with unspecified complications: Secondary | ICD-10-CM | POA: Insufficient documentation

## 2019-03-31 DIAGNOSIS — Z20822 Contact with and (suspected) exposure to covid-19: Secondary | ICD-10-CM | POA: Insufficient documentation

## 2019-03-31 LAB — TYPE AND SCREEN
ABO/RH(D): B POS
Antibody Screen: NEGATIVE

## 2019-03-31 LAB — SARS CORONAVIRUS 2 (TAT 6-24 HRS): SARS Coronavirus 2: NEGATIVE

## 2019-04-01 MED ORDER — DEXTROSE 5 % IV SOLN
3.0000 g | INTRAVENOUS | Status: AC
  Administered 2019-04-02: 3 g via INTRAVENOUS
  Filled 2019-04-01: qty 3

## 2019-04-02 ENCOUNTER — Encounter: Payer: Self-pay | Admitting: Obstetrics & Gynecology

## 2019-04-02 ENCOUNTER — Observation Stay
Admission: RE | Admit: 2019-04-02 | Discharge: 2019-04-02 | Disposition: A | Discharge status: Home / Self Care | Payer: Self-pay | Attending: Obstetrics & Gynecology | Admitting: Obstetrics & Gynecology

## 2019-04-02 ENCOUNTER — Observation Stay: Payer: Self-pay | Admitting: Anesthesiology

## 2019-04-02 ENCOUNTER — Encounter
Admission: RE | Disposition: A | Discharge status: Home / Self Care | Payer: Self-pay | Source: Home / Self Care | Attending: Obstetrics & Gynecology

## 2019-04-02 ENCOUNTER — Other Ambulatory Visit: Payer: Self-pay

## 2019-04-02 ENCOUNTER — Observation Stay: Payer: Self-pay

## 2019-04-02 DIAGNOSIS — Z408 Encounter for other prophylactic surgery: Secondary | ICD-10-CM

## 2019-04-02 DIAGNOSIS — N7011 Chronic salpingitis: Secondary | ICD-10-CM | POA: Insufficient documentation

## 2019-04-02 DIAGNOSIS — E119 Type 2 diabetes mellitus without complications: Secondary | ICD-10-CM | POA: Insufficient documentation

## 2019-04-02 DIAGNOSIS — G8929 Other chronic pain: Secondary | ICD-10-CM | POA: Insufficient documentation

## 2019-04-02 DIAGNOSIS — N801 Endometriosis of ovary: Principal | ICD-10-CM | POA: Insufficient documentation

## 2019-04-02 DIAGNOSIS — Z6841 Body Mass Index (BMI) 40.0 and over, adult: Secondary | ICD-10-CM | POA: Insufficient documentation

## 2019-04-02 DIAGNOSIS — F319 Bipolar disorder, unspecified: Secondary | ICD-10-CM | POA: Insufficient documentation

## 2019-04-02 DIAGNOSIS — M1990 Unspecified osteoarthritis, unspecified site: Secondary | ICD-10-CM | POA: Insufficient documentation

## 2019-04-02 DIAGNOSIS — N809 Endometriosis, unspecified: Secondary | ICD-10-CM

## 2019-04-02 DIAGNOSIS — F17210 Nicotine dependence, cigarettes, uncomplicated: Secondary | ICD-10-CM | POA: Insufficient documentation

## 2019-04-02 DIAGNOSIS — D251 Intramural leiomyoma of uterus: Secondary | ICD-10-CM | POA: Insufficient documentation

## 2019-04-02 DIAGNOSIS — Z20822 Contact with and (suspected) exposure to covid-19: Secondary | ICD-10-CM | POA: Insufficient documentation

## 2019-04-02 DIAGNOSIS — Z793 Long term (current) use of hormonal contraceptives: Secondary | ICD-10-CM | POA: Insufficient documentation

## 2019-04-02 DIAGNOSIS — N736 Female pelvic peritoneal adhesions (postinfective): Secondary | ICD-10-CM

## 2019-04-02 DIAGNOSIS — K219 Gastro-esophageal reflux disease without esophagitis: Secondary | ICD-10-CM | POA: Insufficient documentation

## 2019-04-02 DIAGNOSIS — Z9889 Other specified postprocedural states: Secondary | ICD-10-CM

## 2019-04-02 HISTORY — PX: ROBOTIC ASSISTED TOTAL HYSTERECTOMY WITH BILATERAL SALPINGO OOPHERECTOMY: SHX6086

## 2019-04-02 HISTORY — PX: ROBOTIC ASSISTED LAPAROSCOPIC OVARIAN CYSTECTOMY: SHX6081

## 2019-04-02 HISTORY — PX: CYSTOSCOPY W/ URETERAL STENT REMOVAL: SHX1430

## 2019-04-02 HISTORY — PX: CYSTOSCOPY W/ URETERAL STENT PLACEMENT: SHX1429

## 2019-04-02 LAB — GLUCOSE, CAPILLARY
Glucose-Capillary: 100 mg/dL — ABNORMAL HIGH (ref 70–99)
Glucose-Capillary: 132 mg/dL — ABNORMAL HIGH (ref 70–99)

## 2019-04-02 LAB — POCT PREGNANCY, URINE: Preg Test, Ur: NEGATIVE

## 2019-04-02 SURGERY — HYSTERECTOMY, TOTAL, ROBOT-ASSISTED, LAPAROSCOPIC, WITH BILATERAL SALPINGO-OOPHORECTOMY
Anesthesia: General | Site: Ureter | Laterality: Right

## 2019-04-02 MED ORDER — DEXAMETHASONE SODIUM PHOSPHATE 10 MG/ML IJ SOLN
INTRAMUSCULAR | Status: AC
  Filled 2019-04-02: qty 1

## 2019-04-02 MED ORDER — FENTANYL CITRATE (PF) 100 MCG/2ML IJ SOLN
INTRAMUSCULAR | Status: DC | PRN
  Administered 2019-04-02 (×7): 50 ug via INTRAVENOUS

## 2019-04-02 MED ORDER — SUCCINYLCHOLINE CHLORIDE 20 MG/ML IJ SOLN
INTRAMUSCULAR | Status: DC | PRN
  Administered 2019-04-02: 200 mg via INTRAVENOUS

## 2019-04-02 MED ORDER — OXYCODONE HCL 5 MG PO TABS: 5.0000 mg | ORAL_TABLET | Freq: Once | ORAL | Status: AC | PRN

## 2019-04-02 MED ORDER — DEXMEDETOMIDINE HCL 200 MCG/2ML IV SOLN
INTRAVENOUS | Status: DC | PRN
  Administered 2019-04-02: 12 ug via INTRAVENOUS

## 2019-04-02 MED ORDER — SCOPOLAMINE 1 MG/3DAYS TD PT72
1.0000 | MEDICATED_PATCH | TRANSDERMAL | Status: DC
  Administered 2019-04-02: 1.5 mg via TRANSDERMAL

## 2019-04-02 MED ORDER — OXYCODONE HCL 5 MG PO TABS
ORAL_TABLET | ORAL | Status: AC
  Administered 2019-04-02: 5 mg via ORAL
  Filled 2019-04-02: qty 1

## 2019-04-02 MED ORDER — OXYCODONE HCL 5 MG PO TABS
5.0000 mg | ORAL_TABLET | Freq: Once | ORAL | Status: AC
  Administered 2019-04-02: 18:00:00 5 mg via ORAL
  Filled 2019-04-02: qty 1

## 2019-04-02 MED ORDER — ONDANSETRON HCL 4 MG/2ML IJ SOLN
INTRAMUSCULAR | Status: DC | PRN
  Administered 2019-04-02: 4 mg via INTRAVENOUS

## 2019-04-02 MED ORDER — IOHEXOL 180 MG/ML  SOLN
INTRAMUSCULAR | Status: DC | PRN
  Administered 2019-04-02: 11:00:00 20 mL

## 2019-04-02 MED ORDER — LACTATED RINGERS IV SOLN: INTRAVENOUS | Status: DC | PRN

## 2019-04-02 MED ORDER — MIDAZOLAM HCL 2 MG/2ML IJ SOLN
INTRAMUSCULAR | Status: DC | PRN
  Administered 2019-04-02: 2 mg via INTRAVENOUS

## 2019-04-02 MED ORDER — DEXAMETHASONE SODIUM PHOSPHATE 10 MG/ML IJ SOLN
4.0000 mg | INTRAMUSCULAR | Status: AC
  Administered 2019-04-02: 4 mg via INTRAVENOUS

## 2019-04-02 MED ORDER — KETOROLAC TROMETHAMINE 30 MG/ML IJ SOLN: 30.0000 mg | Freq: Four times a day (QID) | INTRAMUSCULAR | Status: DC

## 2019-04-02 MED ORDER — HYDROMORPHONE HCL 1 MG/ML IJ SOLN
INTRAMUSCULAR | Status: AC
  Administered 2019-04-02: 0.5 mg via INTRAVENOUS
  Filled 2019-04-02: qty 1

## 2019-04-02 MED ORDER — HYDROMORPHONE HCL 1 MG/ML IJ SOLN
0.5000 mg | INTRAMUSCULAR | Status: AC | PRN
  Administered 2019-04-02 (×2): 0.5 mg via INTRAVENOUS

## 2019-04-02 MED ORDER — GABAPENTIN 300 MG PO CAPS
ORAL_CAPSULE | ORAL | Status: AC
  Filled 2019-04-02: qty 2

## 2019-04-02 MED ORDER — HEPARIN SODIUM (PORCINE) 5000 UNIT/ML IJ SOLN: 5000.0000 [IU] | INTRAMUSCULAR | Status: AC

## 2019-04-02 MED ORDER — ENSURE PRE-SURGERY PO LIQD
592.0000 mL | Freq: Once | ORAL | Status: DC
  Filled 2019-04-02: qty 592

## 2019-04-02 MED ORDER — ROCURONIUM BROMIDE 100 MG/10ML IV SOLN
INTRAVENOUS | Status: DC | PRN
  Administered 2019-04-02: 40 mg via INTRAVENOUS
  Administered 2019-04-02 (×2): 10 mg via INTRAVENOUS
  Administered 2019-04-02: 30 mg via INTRAVENOUS
  Administered 2019-04-02: 10 mg via INTRAVENOUS
  Administered 2019-04-02: 20 mg via INTRAVENOUS
  Administered 2019-04-02: 10 mg via INTRAVENOUS

## 2019-04-02 MED ORDER — LIDOCAINE HCL (CARDIAC) PF 100 MG/5ML IV SOSY
PREFILLED_SYRINGE | INTRAVENOUS | Status: DC | PRN
  Administered 2019-04-02: 100 mg via INTRAVENOUS

## 2019-04-02 MED ORDER — CELECOXIB 200 MG PO CAPS
400.0000 mg | ORAL_CAPSULE | ORAL | Status: AC
  Administered 2019-04-02: 400 mg via ORAL

## 2019-04-02 MED ORDER — FAMOTIDINE 20 MG PO TABS
ORAL_TABLET | ORAL | Status: AC
  Filled 2019-04-02: qty 1

## 2019-04-02 MED ORDER — ENSURE PRE-SURGERY PO LIQD
296.0000 mL | Freq: Once | ORAL | Status: DC
  Filled 2019-04-02: qty 296

## 2019-04-02 MED ORDER — FAMOTIDINE 20 MG PO TABS
20.0000 mg | ORAL_TABLET | Freq: Once | ORAL | Status: AC
  Administered 2019-04-02: 20 mg via ORAL

## 2019-04-02 MED ORDER — HEPARIN SODIUM (PORCINE) 5000 UNIT/ML IJ SOLN
INTRAMUSCULAR | Status: AC
  Administered 2019-04-02: 5000 [IU] via SUBCUTANEOUS
  Filled 2019-04-02: qty 1

## 2019-04-02 MED ORDER — INDOCYANINE GREEN 25 MG IV SOLR
INTRAVENOUS | Status: DC | PRN
  Administered 2019-04-02: 09:00:00 25 mg

## 2019-04-02 MED ORDER — OXYCODONE HCL 5 MG PO TABS
ORAL_TABLET | ORAL | Status: AC
  Filled 2019-04-02: qty 1

## 2019-04-02 MED ORDER — INDOCYANINE GREEN 25 MG IV SOLR
INTRAVENOUS | Status: DC | PRN
  Administered 2019-04-02: 25 mg

## 2019-04-02 MED ORDER — FENTANYL CITRATE (PF) 100 MCG/2ML IJ SOLN
25.0000 ug | INTRAMUSCULAR | Status: DC | PRN
  Administered 2019-04-02 (×2): 50 ug via INTRAVENOUS

## 2019-04-02 MED ORDER — ACETAMINOPHEN 500 MG PO TABS
ORAL_TABLET | ORAL | Status: AC
  Filled 2019-04-02: qty 2

## 2019-04-02 MED ORDER — FENTANYL CITRATE (PF) 100 MCG/2ML IJ SOLN
INTRAMUSCULAR | Status: AC
  Administered 2019-04-02: 50 ug via INTRAVENOUS
  Filled 2019-04-02: qty 2

## 2019-04-02 MED ORDER — FENTANYL CITRATE (PF) 100 MCG/2ML IJ SOLN
INTRAMUSCULAR | Status: AC
  Filled 2019-04-02: qty 2

## 2019-04-02 MED ORDER — PROPOFOL 10 MG/ML IV BOLUS
INTRAVENOUS | Status: DC | PRN
  Administered 2019-04-02: 200 mg via INTRAVENOUS

## 2019-04-02 MED ORDER — METRONIDAZOLE IN NACL 5-0.79 MG/ML-% IV SOLN
500.0000 mg | INTRAVENOUS | Status: AC
  Administered 2019-04-02: 09:00:00 500 mg via INTRAVENOUS
  Filled 2019-04-02: qty 100

## 2019-04-02 MED ORDER — SCOPOLAMINE 1 MG/3DAYS TD PT72
MEDICATED_PATCH | TRANSDERMAL | Status: AC
  Filled 2019-04-02: qty 1

## 2019-04-02 MED ORDER — MIDAZOLAM HCL 2 MG/2ML IJ SOLN
INTRAMUSCULAR | Status: AC
  Filled 2019-04-02: qty 2

## 2019-04-02 MED ORDER — OXYCODONE HCL 5 MG PO TABS: 5.0000 mg | ORAL_TABLET | ORAL | 0 refills | Status: AC | PRN

## 2019-04-02 MED ORDER — LABETALOL HCL 5 MG/ML IV SOLN
INTRAVENOUS | Status: DC | PRN
  Administered 2019-04-02 (×2): 10 mg via INTRAVENOUS

## 2019-04-02 MED ORDER — KETOROLAC TROMETHAMINE 30 MG/ML IJ SOLN
INTRAMUSCULAR | Status: AC
  Administered 2019-04-02: 30 mg via INTRAVENOUS
  Filled 2019-04-02: qty 1

## 2019-04-02 MED ORDER — ACETAMINOPHEN 500 MG PO TABS
1000.0000 mg | ORAL_TABLET | ORAL | Status: AC
  Administered 2019-04-02: 08:00:00 1000 mg via ORAL

## 2019-04-02 MED ORDER — CELECOXIB 200 MG PO CAPS
ORAL_CAPSULE | ORAL | Status: AC
  Filled 2019-04-02: qty 2

## 2019-04-02 MED ORDER — SODIUM CHLORIDE 0.9 % IV SOLN: INTRAVENOUS | Status: DC

## 2019-04-02 MED ORDER — DEXAMETHASONE SODIUM PHOSPHATE 10 MG/ML IJ SOLN
INTRAMUSCULAR | Status: DC | PRN
  Administered 2019-04-02: 4 mg via INTRAVENOUS

## 2019-04-02 MED ORDER — BUPIVACAINE HCL 0.5 % IJ SOLN
INTRAMUSCULAR | Status: DC | PRN
  Administered 2019-04-02: 12 mL

## 2019-04-02 MED ORDER — BUPIVACAINE HCL (PF) 0.5 % IJ SOLN
INTRAMUSCULAR | Status: AC
  Filled 2019-04-02: qty 30

## 2019-04-02 MED ORDER — SUGAMMADEX SODIUM 200 MG/2ML IV SOLN
INTRAVENOUS | Status: DC | PRN
  Administered 2019-04-02: 200 mg via INTRAVENOUS

## 2019-04-02 MED ORDER — IBUPROFEN 600 MG PO TABS: 600.0000 mg | ORAL_TABLET | Freq: Four times a day (QID) | ORAL | 1 refills | Status: DC

## 2019-04-02 MED ORDER — SODIUM CHLORIDE FLUSH 0.9 % IV SOLN
INTRAVENOUS | Status: AC
  Filled 2019-04-02: qty 10

## 2019-04-02 MED ORDER — OXYCODONE HCL 5 MG/5ML PO SOLN: 5.0000 mg | Freq: Once | ORAL | Status: AC | PRN

## 2019-04-02 MED ORDER — GABAPENTIN 300 MG PO CAPS
600.0000 mg | ORAL_CAPSULE | ORAL | Status: AC
  Administered 2019-04-02: 600 mg via ORAL

## 2019-04-02 MED ORDER — PROPOFOL 500 MG/50ML IV EMUL
INTRAVENOUS | Status: AC
  Filled 2019-04-02: qty 50

## 2019-04-02 MED ORDER — FENTANYL CITRATE (PF) 250 MCG/5ML IJ SOLN
INTRAMUSCULAR | Status: AC
  Filled 2019-04-02: qty 5

## 2019-04-02 SURGICAL SUPPLY — 84 items
ANCHOR TIS RET SYS 1550ML (BAG) ×2 IMPLANT
ANCHOR TIS RET SYS 235ML (MISCELLANEOUS) ×2 IMPLANT
BAG DRAIN CYSTO-URO LG1000N (MISCELLANEOUS) ×3 IMPLANT
BAG URINE DRAIN 2000ML AR STRL (UROLOGICAL SUPPLIES) ×5 IMPLANT
BLADE SURG SZ10 CARB STEEL (BLADE) ×8 IMPLANT
BLADE SURG SZ11 CARB STEEL (BLADE) ×10 IMPLANT
BRUSH SCRUB EZ 1% IODOPHOR (MISCELLANEOUS) ×5 IMPLANT
CANISTER SUCT 1200ML W/VALVE (MISCELLANEOUS) ×5 IMPLANT
CATH FOLEY 2WAY  5CC 16FR (CATHETERS) ×2
CATH URETL 5X70 OPEN END (CATHETERS) ×7 IMPLANT
CATH URTH 16FR FL 2W BLN LF (CATHETERS) ×3 IMPLANT
CHLORAPREP W/TINT 26 (MISCELLANEOUS) ×5 IMPLANT
COUNTER NEEDLE 20/40 LG (NEEDLE) ×2 IMPLANT
COVER TIP SHEARS 8 DVNC (MISCELLANEOUS) ×3 IMPLANT
COVER TIP SHEARS 8MM DA VINCI (MISCELLANEOUS) ×2
COVER WAND RF STERILE (DRAPES) ×5 IMPLANT
DEFOGGER SCOPE WARMER CLEARIFY (MISCELLANEOUS) ×5 IMPLANT
DERMABOND ADVANCED (GAUZE/BANDAGES/DRESSINGS) ×2
DERMABOND ADVANCED .7 DNX12 (GAUZE/BANDAGES/DRESSINGS) ×3 IMPLANT
DRAPE 3/4 80X56 (DRAPES) ×15 IMPLANT
DRAPE ARM DVNC X/XI (DISPOSABLE) ×12 IMPLANT
DRAPE COLUMN DVNC XI (DISPOSABLE) ×3 IMPLANT
DRAPE DA VINCI XI ARM (DISPOSABLE) ×8
DRAPE DA VINCI XI COLUMN (DISPOSABLE) ×2
DRAPE LEGGINS SURG 28X43 STRL (DRAPES) ×5 IMPLANT
DRAPE UNDER BUTTOCK W/FLU (DRAPES) ×7 IMPLANT
ELECT REM PT RETURN 9FT ADLT (ELECTROSURGICAL) ×5
ELECTRODE REM PT RTRN 9FT ADLT (ELECTROSURGICAL) ×3 IMPLANT
GAUZE 4X4 16PLY RFD (DISPOSABLE) ×2 IMPLANT
GLOVE BIO SURGEON STRL SZ 6.5 (GLOVE) ×8 IMPLANT
GLOVE BIO SURGEONS STRL SZ 6.5 (GLOVE) ×5
GLOVE SURG SYN 6.5 ES PF (GLOVE) ×20 IMPLANT
GLOVE SURG SYN 6.5 PF PI (GLOVE) ×12 IMPLANT
GOWN STRL REUS W/ TWL LRG LVL3 (GOWN DISPOSABLE) ×18 IMPLANT
GOWN STRL REUS W/TWL LRG LVL3 (GOWN DISPOSABLE) ×12
GRASPER SUT TROCAR 14GX15 (MISCELLANEOUS) ×2 IMPLANT
GUIDEWIRE STR DUAL SENSOR (WIRE) ×5 IMPLANT
IRRIGATION STRYKERFLOW (MISCELLANEOUS) IMPLANT
IRRIGATOR STRYKERFLOW (MISCELLANEOUS) ×5
IV NS 1000ML (IV SOLUTION) ×2
IV NS 1000ML BAXH (IV SOLUTION) IMPLANT
KIT PINK PAD W/HEAD ARE REST (MISCELLANEOUS) ×5
KIT PINK PAD W/HEAD ARM REST (MISCELLANEOUS) ×3 IMPLANT
KIT TURNOVER CYSTO (KITS) ×5 IMPLANT
L-HOOK LAP DISP 36CM (ELECTROSURGICAL) ×5
LABEL OR SOLS (LABEL) ×5 IMPLANT
LHOOK LAP DISP 36CM (ELECTROSURGICAL) IMPLANT
MANIPULATOR VCARE LG CRV RETR (MISCELLANEOUS) IMPLANT
MANIPULATOR VCARE SML CRV RETR (MISCELLANEOUS) ×2 IMPLANT
MANIPULATOR VCARE STD CRV RETR (MISCELLANEOUS) IMPLANT
NEEDLE HYPO 22GX1.5 SAFETY (NEEDLE) ×5 IMPLANT
NEEDLE VERESS 14GA 120MM (NEEDLE) ×5 IMPLANT
NS IRRIG 1000ML POUR BTL (IV SOLUTION) ×5 IMPLANT
OBTURATOR OPTICAL STANDARD 8MM (TROCAR) ×2
OBTURATOR OPTICAL STND 8 DVNC (TROCAR) ×3
OBTURATOR OPTICALSTD 8 DVNC (TROCAR) ×3 IMPLANT
PACK CYSTO AR (MISCELLANEOUS) ×5 IMPLANT
PACK LAP CHOLECYSTECTOMY (MISCELLANEOUS) ×5 IMPLANT
PAD OB MATERNITY 4.3X12.25 (PERSONAL CARE ITEMS) ×5 IMPLANT
PAD PREP 24X41 OB/GYN DISP (PERSONAL CARE ITEMS) ×5 IMPLANT
PENCIL ELECTRO HAND CTR (MISCELLANEOUS) ×5 IMPLANT
SEAL CANN UNIV 5-8 DVNC XI (MISCELLANEOUS) ×12 IMPLANT
SEAL XI 5MM-8MM UNIVERSAL (MISCELLANEOUS) ×8
SEALER VESSEL DA VINCI XI (MISCELLANEOUS)
SEALER VESSEL EXT DVNC XI (MISCELLANEOUS) IMPLANT
SET CYSTO W/LG BORE CLAMP LF (SET/KITS/TRAYS/PACK) ×5 IMPLANT
SET TUBE SMOKE EVAC HIGH FLOW (TUBING) ×5 IMPLANT
SOL .9 NS 3000ML IRR  AL (IV SOLUTION) ×2
SOL .9 NS 3000ML IRR UROMATIC (IV SOLUTION) ×3 IMPLANT
SOLUTION ELECTROLUBE (MISCELLANEOUS) ×5 IMPLANT
STENT URET 6FRX24 CONTOUR (STENTS) ×4 IMPLANT
STENT URET 6FRX26 CONTOUR (STENTS) IMPLANT
SURGILUBE 2OZ TUBE FLIPTOP (MISCELLANEOUS) ×5 IMPLANT
SUT DVC VLOC 180 0 12IN GS21 (SUTURE) ×5
SUT MNCRL 4-0 (SUTURE) ×2
SUT MNCRL 4-0 27XMFL (SUTURE) ×3
SUT VIC AB 0 CT1 36 (SUTURE) ×5 IMPLANT
SUT VICRYL 0 AB UR-6 (SUTURE) ×2 IMPLANT
SUTURE DVC VLC 180 0 12IN GS21 (SUTURE) IMPLANT
SUTURE MNCRL 4-0 27XMF (SUTURE) ×3 IMPLANT
SYR 10ML LL (SYRINGE) ×7 IMPLANT
SYRINGE IRR TOOMEY STRL 70CC (SYRINGE) ×5 IMPLANT
TROCAR XCEL NON-BLD 5MMX100MML (ENDOMECHANICALS) ×2 IMPLANT
WATER STERILE IRR 1000ML POUR (IV SOLUTION) ×5 IMPLANT

## 2019-04-02 NOTE — H&P (Addendum)
Preoperative History and Physical  Tara Estes is a 29 y.o.. here for surgical management of acute on chronic pelvic pain, and stage 4 endometriosis (frozen pelvis).   No significant preoperative concerns.  Proposed surgery: Robotic-assisted total laparoscopic hysterectomy, bilateral salpingectomy Lysis of adhesions, bilateral urteteral stent placement, and likely unilateral oophorectomy, possible open  Past Medical History:  Diagnosis Date  . Arthritis   . Bipolar 1 disorder ([place])    younger, no meds now  . Diabetes mellitus   . Endometriosis   . GERD (gastroesophageal reflux disease)   . Headache    Past Surgical History:  Procedure Laterality Date  . head injury     requiring closure  . LAPAROSCOPY N/A 03/07/2019   Procedure: LAPAROSCOPY DIAGNOSTIC, lysis of adhesions;  Surgeon: Ward, Honor Loh, MD;  Location: [facility] ORS;  Service: Gynecology;  Laterality: N/A;   OB History  No obstetric history on file.  Patient denies any other pertinent gynecologic issues.   No current facility-administered medications on file prior to encounter.   Current Outpatient Medications on File Prior to Encounter  Medication Sig Dispense Refill  . norethindrone (AYGESTIN) 5 MG tablet Take 1 tablet (5 mg total) by mouth daily. (Patient taking differently: Take 5 mg by mouth 3 (three) times daily. ) 30 tablet 11  . ibuprofen (ADVIL) 600 MG tablet Take 1 tablet (600 mg total) by mouth every 6 (six) hours. 45 tablet 1   Allergies  Allergen Reactions  . Other Anaphylaxis and Hives    CHILI POWDER    Social History:   reports that she has been smoking cigarettes. She has been smoking about 0.50 packs per day. She has never used smokeless tobacco. She reports current alcohol use. She reports that she does not use drugs.  History reviewed. No pertinent family history.  Review of Systems: Noncontributory  PHYSICAL EXAM: Blood pressure (!) 156/84, pulse 82, temperature 97.6 F (36.4 C),  temperature source Tympanic, resp. rate 20, last menstrual period 02/27/2019, SpO2 100 %. General appearance - alert, well appearing, and in no distress Chest - clear to auscultation, no wheezes, rales or rhonchi, symmetric air entry Heart - normal rate and regular rhythm Abdomen - soft, nontender, nondistended, no masses or organomegaly Pelvic - examination not indicated Extremities - peripheral pulses normal, no pedal edema, no clubbing or cyanosis  Labs: Results for orders placed or performed during the hospital encounter of 04/02/19 (from the past 336 hour(s))  Glucose, capillary   Collection Time: 04/02/19  6:56 AM  Result Value Ref Range   Glucose-Capillary 100 (H) 70 - 99 mg/dL  Pregnancy, urine POC   Collection Time: 04/02/19  6:57 AM  Result Value Ref Range   Preg Test, Ur NEGATIVE NEGATIVE  Results for orders placed or performed during the hospital encounter of 03/31/19 (from the past 336 hour(s))  SARS CORONAVIRUS 2 (TAT 6-24 HRS) Nasopharyngeal Nasopharyngeal Swab   Collection Time: 03/31/19  9:55 AM   Specimen: Nasopharyngeal Swab  Result Value Ref Range   SARS Coronavirus 2 NEGATIVE NEGATIVE  Type and screen [place]   Collection Time: 03/31/19 10:49 AM  Result Value Ref Range   ABO/RH(D) B POS    Antibody Screen NEG    Sample Expiration 04/14/2019,2359    Extend sample reason      NO TRANSFUSIONS OR PREGNANCY IN THE PAST 3 MONTHS Performed at [facility], [address], [place], [place] [address]     Imaging Studies: CT ABDOMEN PELVIS W  CONTRAST  Result Date: 03/07/2019 CLINICAL DATA:  Left flank and pelvic pain. EXAM: CT ABDOMEN AND PELVIS WITH CONTRAST TECHNIQUE: Multidetector CT imaging of the abdomen and pelvis was performed using the standard protocol following bolus administration of intravenous contrast. CONTRAST:  176mL OMNIPAQUE IOHEXOL 300 MG/ML  SOLN COMPARISON:  None. FINDINGS: Lower chest: The lung bases are clear.  The heart is normal in size. No pericardial effusion. Hepatobiliary: No focal hepatic lesions or intrahepatic biliary dilatation. The gallbladder is normal. No common bile duct dilatation. Pancreas: No mass, inflammation or ductal dilatation. Spleen: Normal size. No focal lesions. Adrenals/Urinary Tract: Adrenal glands and kidneys are unremarkable. No renal, ureteral or bladder calculi or mass. Stomach/Bowel: The stomach, duodenum, small bowel and colon are grossly normal without oral contrast. No acute inflammatory changes, mass lesions or obstructive findings. The terminal ileum is normal. The appendix is normal. Vascular/Lymphatic: The aorta is normal in caliber. No dissection. The branch vessels are patent. The major venous structures are patent. No mesenteric or retroperitoneal mass or adenopathy. Small scattered lymph nodes are noted. Reproductive: The uterus is unremarkable. 4.8 x 3.7 cm slightly complex cyst associated with the right ovary. There appears to be a small amount of dependent hemorrhage. 8.4 x 6.8 cm complex lesion associated with the left ovary. There are septations and increased attenuation which could suggest hemorrhage. If there is any clinical concern for ovarian torsion I would recommend a pelvic ultrasound with Doppler examination. Other: No pelvic adenopathy. No free pelvic fluid collections. No inguinal mass or adenopathy. No abdominal wall hernia or subcutaneous lesions. Musculoskeletal: No significant bony findings. IMPRESSION: 1. 8.4 x 6.8 cm complex cystic lesion associated with the left ovary. There are septations and increased attenuation suggesting hemorrhage. If there is any clinical concern for ovarian torsion I would recommend a pelvic ultrasound with Doppler examination. 2. 4.8 x 3.7 cm slightly complex cyst associated with the right ovary. 3. No acute abdominal findings or adenopathy. Electronically Signed   By: Marijo Sanes M.D.   On: 03/07/2019 13:36   US PELVIC COMPLETE  W TRANSVAGINAL AND TORSION R/O  Result Date: 03/07/2019 CLINICAL DATA:  29 year old female with pelvic pain. EXAM: TRANSABDOMINAL AND TRANSVAGINAL ULTRASOUND OF PELVIS DOPPLER ULTRASOUND OF OVARIES TECHNIQUE: Both transabdominal and transvaginal ultrasound examinations of the pelvis were performed. Transabdominal technique was performed for global imaging of the pelvis including uterus, ovaries, adnexal regions, and pelvic cul-de-sac. It was necessary to proceed with endovaginal exam following the transabdominal exam to visualize the endometrium and ovaries. Color and duplex Doppler ultrasound was utilized to evaluate blood flow to the ovaries. COMPARISON:  CT abdomen pelvis dated 03/07/2019. FINDINGS: Uterus Measurements: 11.8 x 5.0 x 4.2 cm = volume: 131 mL. The uterus is anteverted and heterogeneous. There is a 2.3 x 2.7 x 3.0 cm fundal subserosal or partially exophytic fibroid and a 3.0 x 3.1 x 2.6 cm right posterior body intramural fibroid. There is apparent associated mild mass effect by the posterior fibroid with mild displacement of the lower endometrium anteriorly. Endometrium Thickness: 7 mm.  No focal abnormality visualized. Right ovary Measurements: 7.1 x 5.6 x 6.1 cm = volume: 12.6 mL. There is a 4.7 x 5.1 x 4.1 cm complex cystic lesion with uniform echogenic content which may represent a hemorrhagic cyst versus an endometrioma. No internal vascularity identified within this structure. Doppler images demonstrate flow within the ovarian tissue. Left ovary Measurements: 9.5 x 6.0 x 7.5 cm = volume: 22.4 mL. There is a complex cystic structure with  cuneiform echogenic content in the left ovary which may represent a hemorrhagic cyst or an endometrioma. No internal vascularity is noted within this structure. There is flow in the ovarian tissue. Pulsed Doppler evaluation of both ovaries demonstrates normal low-resistance arterial and venous waveforms. Other findings No abnormal free fluid. IMPRESSION: 1.  Bilateral ovarian hemorrhagic cyst versus endometrioma. Follow-up with ultrasound in 8 weeks recommended. 2. Doppler detected flow to both ovaries. 3. Heterogeneous uterus with two fibroids. Electronically Signed   By: Anner Crete M.D.   On: 03/07/2019 15:17    Assessment: Patient Active Problem List   Diagnosis Date Noted  . Post-operative state 04/02/2019  . S/P laparoscopic surgery 03/07/2019    Plan: Patient will undergo surgical management with Robotic-assisted total laparoscopic hysterectomy, bilateral salpingectomy Lysis of adhesions, and likely unilateral oophorectomy.   The risks of surgery were discussed in detail with the patient including but not limited to: bleeding which may require transfusion or reoperation; infection which may require antibiotics; injury to surrounding organs which may involve bowel, bladder, ureters ; need for additional procedures including laparoscopy or laparotomy; thromboembolic phenomenon, surgical site problems and other postoperative/anesthesia complications. Likelihood of success in alleviating the patient's condition was discussed. Routine postoperative instructions will be reviewed with the patient and her family in detail after surgery.  The patient concurred with the proposed plan, giving informed written consent for the surgery.  Patient has been NPO since last night she will remain NPO for procedure.  Anesthesia and OR aware.  Preoperative prophylactic antibiotics and SCDs ordered on call to the OR.  To OR when ready.  ----- Larey Days, MD, [place] Attending Obstetrician and Gynecologist [facility], Department of [facility]  04/02/2019 8:33 AM

## 2019-04-02 NOTE — Transfer of Care (Signed)
Immediate Anesthesia Transfer of Care Note  Patient: Tara Estes  Procedure(s) Performed: XI ROBOTIC ASSISTED TOTAL HYSTERECTOMY WITH LEFT OOPHORECTOMY, BILATERAL SALPINGECTOMY, OVARIOLYSIS, ENTEROLYSIS, (Bilateral ) XI ROBOTIC ASSISTED LAPAROSCOPIC OVARIAN CYSTECTOMY (Right Abdomen) CYSTOSCOPY WITH STENT REMOVAL (Bilateral Ureter) CYSTOSCOPY WITH RETROGRADE PYELOGRAM/URETERAL STENT PLACEMENT (Bilateral Ureter)  Patient Location: PACU  Anesthesia Type:General  Level of Consciousness: awake  Airway & Oxygen Therapy: Patient Spontanous Breathing  Post-op Assessment: Report given to RN  Post vital signs: stable  Last Vitals:  Vitals Value Taken Time  BP    Temp    Pulse    Resp    SpO2      Last Pain:  Vitals:   04/02/19 0655  TempSrc: Tympanic  PainSc: 7          Complications: No apparent anesthesia complications

## 2019-04-02 NOTE — Op Note (Signed)
Date of procedure: 04/02/19  Preoperative diagnosis:  1. Endometriosis  Postoperative diagnosis:  1. Same as above  Procedure: 1. Cystoscopy 2. Bilateral retrograde pyelogram 3. Bilateral double-J ureteral stent placement  Surgeon: Hollice Espy, MD  Anesthesia: General  Complications: None  Intraoperative findings: Normal upper tract collecting system x2.  Stent was placed without difficulty.  ICG instilled into ureters bilaterally.  EBL: Minimal  Specimens: None  Drains: 6 x 24 French double-J ureteral stent x2  Indication: Tara Estes is a 29 y.o. patient with severe endometriosis undergoing surgical procedure by Dr. Leonides Schanz.  She is asked for placement of bilateral ureteral stents and ICG instillation for assistance with ureteral identification..  After reviewing the management options for treatment, she elected to proceed with the above surgical procedure(s). We have discussed the potential benefits and risks of the procedure, side effects of the proposed treatment, the likelihood of the patient achieving the goals of the procedure, and any potential problems that might occur during the procedure or recuperation. Informed consent has been obtained.  Description of procedure:  The patient was taken to the operating room and general anesthesia was induced.  The patient was placed in the dorsal lithotomy position, prepped and draped in the usual sterile fashion, and preoperative antibiotics were administered. A preoperative time-out was performed.   A 21 French scope was advanced per urethra into the bladder.  Notably, the bladder itself was fairly unremarkable.  Attention was turned to the left ureteral orifice.  This was cannulated using a 5 [place] open-ended ureteral catheter.  Gentle retrograde pyelogram was performed which revealed a delicate appearing ureter and a normal upper tract collecting system without hydroureteronephrosis.  A sensor wire was then placed up to the  level of the kidney.  The 5 [place] open-ended ureteral catheter was advanced up to the level of 20 cm of the wire was removed.  I explained time, 25 mg of ICG diluted in 10 cc of normal saline was injected through the open-ended ureteral catheter simultaneously as the catheter was withdrawn.  When the catheter tip was at the distalmost aspect of the ureter and all ICG was injected, the open-ended ureteral catheter was recannulated using a sensor wire up to level of the kidney again.  The open-ended ureteral catheter was then removed and a 6 x 24 French double-J ureteral stent was advanced over the wire up to level the renal pelvis.  The wires partially drawn until full coils noted both within the renal pelvis as well as within the bladder.  This was confirmed fluoroscopically as well as cystoscopically.  Next, the same exact procedure was performed on the right side.  The ICG was instilled in a similar manner and the stent was placed without difficulty.  There were no upper tract filling defects or any abnormalities on this renal unit or ureter as well.  Stent placement was uncomplicated.  The scope was removed.  The remainder of the procedure was then completed by Dr. Leonides Schanz, please see her notes for dictation.  Hollice Espy, M.D.

## 2019-04-02 NOTE — Anesthesia Procedure Notes (Signed)
Procedure Name: Intubation Date/Time: 04/02/2019 8:52 AM Performed by: Leander Rams, CRNA Pre-anesthesia Checklist: Patient identified, Emergency Drugs available, Suction available, Patient being monitored and Timeout performed Patient Re-evaluated:Patient Re-evaluated prior to induction Oxygen Delivery Method: Circle system utilized Preoxygenation: Pre-oxygenation with 100% oxygen Induction Type: IV induction Ventilation: Mask ventilation without difficulty Laryngoscope Size: McGraph and 3 Grade View: Grade I Tube type: Oral Tube size: 7.0 mm Number of attempts: 1 Airway Equipment and Method: Stylet Placement Confirmation: ETT inserted through vocal cords under direct vision,  positive ETCO2,  CO2 detector and breath sounds checked- equal and bilateral Secured at: 21 cm Tube secured with: Tape Dental Injury: Teeth and Oropharynx as per pre-operative assessment

## 2019-04-02 NOTE — Op Note (Addendum)
04/02/2019  PATIENT:  Tara Estes  29 y.o. female  PRE-OPERATIVE DIAGNOSIS:  frozen pelvis, stage 4 endometriosis, acute on chronic pelvic pain  POST-OPERATIVE DIAGNOSIS:  frozen pelvis, stage 4 endometriosis, acute on chronic pelvic pain  PROCEDURE:  Procedure(s): XI ROBOTIC ASSISTED TOTAL HYSTERECTOMY WITH LEFT SALPINGO-OOPHORECTOMY, RIGHT SALPINGECTOMY, OVARIOLYSIS, ENTEROLYSIS,  CYSTOSCOPY WITH RETROGRADE PYELOGRAM/URETERAL STENT PLACEMENT (Bilateral), XI ROBOTIC ASSISTED LAPAROSCOPIC OVARIAN CYSTECTOMY (Right) CYSTOSCOPY WITH STENT REMOVAL (Bilateral)  SURGEON:  Surgeon(s) and Role: Panel 1:    * Jamani Eley, Honor Loh, MD - Primary    * Secord, Venida Jarvis, MD - Assisting Panel 2:    Hollice Espy, MD - Primary  ANESTHESIA: GET  EBL:  Total I/O In: 1000 [I.V.:1000] Out: 150 [Urine:100; Blood:50]  DRAINS: foley to gravity, bilteral ureteral stent, removed at conclusion of the case.  SPECIMEN:  Uterus, Cervix,  left tube and ovary complex right fallopian tube Right ovarian cyst well Pelvic washings  DISPOSITION OF SPECIMEN:  To pathology  FINDINGS: 1. Normal bladder, patent ureters bilaterally 2. Normal genitalia, small cervix 3.  Normal upper abdomen 4.  Enlarged fibroid uterus 6.  Stage IV endometriosis, with frozen pelvis. 7.  Bilaterally enlarged ovaries, with endometriomas, filling the space of the pelvis completely. 8.  All pelvic organs adherent to each other, pelvic sidewalls, sigmoid colon. 9.  Sigmoid colon adherent to left pelvic sidewall 10.  Endometriosis nodules at the entry to the inguinal canal; small inguinal hernia 11.  Bilateral ureters and bladder remained identifiable with ICG throughout the case. 12.  Non-expanding retroperitoneal hematoma adjacent to umbilicus on right.  COUNTS: correct x2  COMPLICATIONS: none apparent  PATIENT DISPOSITION:  VS stable to PACU    Indication for surgery: Patient had presented with complaints of  pelvic pain - she was seen in the ED and taken for surgery for suspicion of ovarian torsion.  What was discovered was a frozen pelvis and the surgery was abandoned.  She was rescheduled for a more comprehensive surgery which would be definitive.  Various treatment options were discussed and patient requested a hysterectomy to resolve her symptoms.  Risks benefits and alternatives were reviewed and informed consent was obtained.   Procedure: The patient was brought to the OR and identified as Tara Estes.  She was given general anesthesia via endotracheal route.  Orogastric tube was placed.  She was then positioned in the dorsal lithotomy position and prepped and draped in the usual sterile fashion.  A surgical time-out was called. Dr. Erlene Quan placed bilateral ureteral stents and ICG instillation for identification of the ureters throughout the case.   A foley catheter was placed.  A speculum was placed in the vagina and the cervix was visualized, grasped with a single tooth tenaculum and the V-Care uterine manipulator was placed in and around the cervix.  After a change of gloves, the attention was turned to the abdomen.  An umbilical incision was made.  The subcutaneous tissues were dissected, the fascia was divided, the peritoneum entered, and robotic trochar was inserted.  Pneumoperitoneum was created to 32mmHg.  Three additional robotic trochars were inserted atraumatically under visualization, and a 38mm assist port placed at palmer's point.  The patient was placed in steep trendelenburg, and the [employer] robot was docked and monopolar scissors, bipolar fenestrated grasper, and maryland were employed.  A brief survey of the upper abdomen was performed.  The attention was turned to the pelvis.  The right round ligament was divided and the peritoneum retracted medially and  opened for retroperitoneal view.  The IP ligament was isolated.  The utero-ovarian ligament was cauterized and divided, and the  bridge between the round ligament and uterus was cauterized and divided.  The right ovary was impressively enlarged and filled most of the pelvis.  It was adherent to the pelvic sidewall on the right, posteriorly, inferiorly, and to the uterus medially.  It was adherent to the left ovary in the midline.  These adhesions were carefully dissected, both bluntly and with cautery.  In doing so, the ovarian cyst was ruptured, and revealed an endometrioma.  The broad ligament was carried down to the uterine vessels on the right. Attention was then turned to the left.  The sigmoid colon was adherent to the pelvic sidewall, the ovary, the ovarian fossa, the uterus, and the right ovary.  The left pelvic peritoneum was divided, and the proximal sigmoid was carefully dissected off of the peritoneal wall, in order to identify both the ureter and the IP ligament.  There was an apex of sigmoid tethered to the ovarian fossa, and a bilateral approach to this point was taken in order to safely dissect away the colon from the adnexa.  This was achieved without injury.  Once the IP ligament was exposed, the round ligament and the superior peritoneum adjacent to the IP ligament was divided.  This allowed the IP ligament to be retracted medially, and the peritoneum posteriorly to be divided, isolating the vessels.  The IP ligament was then thrice cauterized and transected.  The ureter was identified prior to this, and uninvolved.  The ovary was then separated from the lateral pelvic sidewall with blunt dissection.  The broad ligament was divided to the level of the uterine vessels on the left.  The anterior broad was divided further and across the cervix to create a bladder flap of the vesicouterine peritoneum.  The uterine vessels were cauterized on the left, and divided along the parametrium.  The same was performed on the right.  The left tube and ovary were divided from the cornua of the uterus in order to facilitate mobility. With  the uterus isolated, the attention was turned to the posterior cul-de-sac, which had been obliterated by endometriosis, and filled with the bilateral ovaries.  The left ovary was still adherent posteriorly, and inferiorly, and medially was attached to the rectum.  The rectum formed and apex into the cul-de-sac, with adhesions to the right ovary as well.  The right ovary was grasped and slowly dissected off of the posterior cul-de-sac peritoneum and the rectum.  This freed up the ovary and it was reflected out of the pelvis.  The left ovary ruptured with manipulation and was also a large endometrioma.  Once this structure was less taut, the ovarian tissue was grasped and blunt and sharp dissection was used to remove the ovary from the left posterior cul-de-sac as well as the rectum.  The rectum was not involved with the posterior uterus or cervix, so a colpotomy could be safely made in this area.  Thus, using cautery colpotomy was made circumferentially around the cervical cap.   The right mesosalpinx was divided, removing the fallopian tube. The right fallopian tube, left tube and ovary were passed through the vagina and handed off to nursing.  A large endoscopic bag was inserted through the vagina and the uterus was placed inside.  The bag was retracted through the vagina and the uterus was morcellated with a 10 blade for removal.  No injury to the  vagina was noted. The instruments were changed to needle drivers, and the vaginal cuff was closed using V-Lock barbed suture in a running stitch.  The cuff was tested for integrity.   The right ovary was further opened and the cyst wall was peeled off of the stroma.  This was placed in a bag and removed through the umbilical port site.  The abdomen was irrigated copiously, and all surgical sites were hemostatic. The umbilical fascia was closed with 0 vicryl using the inlet closure device. The laparoscopic instruments were removed, the remaining trochars were  removed and the skin of all incision were closed with 4-0 monocryl and covered with surgical glue.  The vagina was inspected for lacerations, and the foley catheter was removed.   The cystoscope was re-inserted and confirmed no damage to the bladder wall.  The bilateral utereral stents were removed with a grasper.   The procedure was then deemed complete.    The sponge, needle, and instrument counts were correct x2.  The patient tolerated reversal of anesthesia, and was brought to the PACU in a stable condition.  I was present and performed this case in its entirety.  Due to patient's super morbid obesity and extensive pelvic adhesive disease, this case was exceptionally difficult to perform and will be coded at a higher degree of skill required to perform.     Larey Days, MD Attending Obstetrician and Gynecologist [facility]

## 2019-04-02 NOTE — Progress Notes (Signed)
Pt voided 450 mL, Dr. Leonides Schanz notified pt had 823 mL in bladder from bladder scan. Per MD pt is ok to be discharged home.

## 2019-04-02 NOTE — Anesthesia Preprocedure Evaluation (Signed)
Anesthesia Evaluation  Patient identified by MRN, date of birth, ID band Patient awake    Reviewed: Allergy & Precautions, H&P , NPO status , Patient's Chart, lab work & pertinent test results  History of Anesthesia Complications Negative for: history of anesthetic complications  Airway Mallampati: II  TM Distance: >3 FB Neck ROM: full    Dental  (+) Chipped   Pulmonary neg shortness of breath, Current Smoker and Patient abstained from smoking.,           Cardiovascular Exercise Tolerance: Good (-) angina(-) Past MI and (-) DOE negative cardio ROS       Neuro/Psych  Headaches, PSYCHIATRIC DISORDERS negative psych ROS   GI/Hepatic Neg liver ROS, GERD  Medicated and Controlled,  Endo/Other  diabetes, Type 2  Renal/GU      Musculoskeletal  (+) Arthritis ,   Abdominal   Peds  Hematology negative hematology ROS (+)   Anesthesia Other Findings Past Medical History: No date: Arthritis No date: Bipolar 1 disorder (HCC)     Comment:  younger, no meds now No date: Diabetes mellitus No date: Endometriosis No date: GERD (gastroesophageal reflux disease) No date: Headache  Past Surgical History: No date: head injury     Comment:  requiring closure 03/07/2019: LAPAROSCOPY; N/A     Comment:  Procedure: LAPAROSCOPY DIAGNOSTIC, lysis of adhesions;                Surgeon: Ward, Honor Loh, MD;  Location: [facility]S;                Service: Gynecology;  Laterality: N/A;     Reproductive/Obstetrics negative OB ROS                             Anesthesia Physical Anesthesia Plan  ASA: III  Anesthesia Plan: General ETT   Post-op Pain Management:    Induction: Intravenous  PONV Risk Score and Plan: Ondansetron, Dexamethasone, Midazolam and Treatment may vary due to age or medical condition  Airway Management Planned: Oral ETT  Additional Equipment:   Intra-op Plan:   Post-operative Plan:  Extubation in OR  Informed Consent: I have reviewed the patients History and Physical, chart, labs and discussed the procedure including the risks, benefits and alternatives for the proposed anesthesia with the patient or authorized representative who has indicated his/her understanding and acceptance.     Dental Advisory Given  Plan Discussed with: Anesthesiologist, CRNA and Surgeon  Anesthesia Plan Comments: (Patient consented for risks of anesthesia including but not limited to:  - adverse reactions to medications - damage to teeth, lips or other oral mucosa - sore throat or hoarseness - Damage to heart, brain, lungs or loss of life  Patient voiced understanding.)        Anesthesia Quick Evaluation

## 2019-04-02 NOTE — Discharge Instructions (Signed)
Discharge instructions:  Call office if you have any of the following: fever >101 F, chills, shortness of breath, excessive vaginal bleeding, incision drainage or problems, leg pain or redness, or any other concerns.   Activity: Do not lift > 20 lbs for 8 weeks.  No intercourse or tampons for 8 weeks.  No driving until you are certain you can slam on the brakes, and of course never while taking narcotics.   You may feel some pain in your upper right abdomen/rib and right shoulder.  This is from the gas in the abdomen for surgery. This will subside over time, please be patient!  Take 600mg  Ibuprofen and 1000mg  Tylenol together, around the clock, every 6 hours for at least the first 3-5 days.  After this you can take as needed.  This will help decrease inflammation and promote healing.  The narcotics you'll take just as needed, as they just trick your brain into thinking its not in pain.    Please don't limit yourself in terms of routine activity.  You will be able to do most things, although they may take longer to do or be a little painful.  You can do it!  Don't be a hero, but don't be a wimp either!   AMBULATORY SURGERY  DISCHARGE INSTRUCTIONS   1) The drugs that you were given will stay in your system until tomorrow so for the next 24 hours you should not:  A) Drive an automobile B) Make any legal decisions C) Drink any alcoholic beverage   2) You may resume regular meals tomorrow.  Today it is better to start with liquids and gradually work up to solid foods.  You may eat anything you prefer, but it is better to start with liquids, then soup and crackers, and gradually work up to solid foods.   3) Please notify your doctor immediately if you have any unusual bleeding, trouble breathing, redness and pain at the surgery site, drainage, fever, or pain not relieved by medication.    4) Additional Instructions:        Please contact your physician with any problems or Same  Day Surgery at [phone], Monday through Friday 6 am to 4 pm, or [place] [facility] number at [phone].   Oxycodone 5mg  taken March 17 at 4:40pm.  Per Dr. Leonides Schanz, oxycodone 5mg  (second dose) taken March 17 at 6:10pm. Next dose (5mg ) if needed at 10:10pm

## 2019-04-03 LAB — CYTOLOGY - NON PAP

## 2019-04-03 NOTE — Anesthesia Postprocedure Evaluation (Signed)
Anesthesia Post Note  Patient: Tara Estes  Procedure(s) Performed: XI ROBOTIC ASSISTED TOTAL HYSTERECTOMY WITH LEFT OOPHORECTOMY, BILATERAL SALPINGECTOMY, OVARIOLYSIS, ENTEROLYSIS, (Bilateral ) XI ROBOTIC ASSISTED LAPAROSCOPIC OVARIAN CYSTECTOMY (Right Abdomen) CYSTOSCOPY WITH STENT REMOVAL (Bilateral Ureter) CYSTOSCOPY WITH RETROGRADE PYELOGRAM/URETERAL STENT PLACEMENT (Bilateral Ureter)  Patient location during evaluation: PACU Anesthesia Type: General Level of consciousness: awake and alert Pain management: pain level controlled Vital Signs Assessment: post-procedure vital signs reviewed and stable Respiratory status: spontaneous breathing, nonlabored ventilation, respiratory function stable and patient connected to nasal cannula oxygen Cardiovascular status: blood pressure returned to baseline and stable Postop Assessment: no apparent nausea or vomiting Anesthetic complications: no     Last Vitals:  Vitals:   04/02/19 1710 04/02/19 1826  BP: (!) 148/82 (!) 156/91  Pulse: 95 95  Resp: 16 16  Temp: 37.2 C   SpO2: 100% 96%    Last Pain:  Vitals:   04/02/19 1826  TempSrc:   PainSc: 5                  Precious Haws Leary Mcnulty

## 2019-04-07 LAB — SURGICAL PATHOLOGY

## 2020-02-07 ENCOUNTER — Emergency Department
Admission: EM | Admit: 2020-02-07 | Discharge: 2020-02-07 | Disposition: A | Discharge status: Home / Self Care | Payer: HRSA Program | Attending: Emergency Medicine | Admitting: Emergency Medicine

## 2020-02-07 ENCOUNTER — Encounter: Payer: Self-pay | Admitting: Emergency Medicine

## 2020-02-07 ENCOUNTER — Emergency Department: Payer: HRSA Program

## 2020-02-07 ENCOUNTER — Other Ambulatory Visit: Payer: Self-pay

## 2020-02-07 DIAGNOSIS — U071 COVID-19: Secondary | ICD-10-CM | POA: Insufficient documentation

## 2020-02-07 DIAGNOSIS — E119 Type 2 diabetes mellitus without complications: Secondary | ICD-10-CM | POA: Insufficient documentation

## 2020-02-07 DIAGNOSIS — J209 Acute bronchitis, unspecified: Secondary | ICD-10-CM | POA: Insufficient documentation

## 2020-02-07 DIAGNOSIS — R059 Cough, unspecified: Secondary | ICD-10-CM | POA: Diagnosis present

## 2020-02-07 DIAGNOSIS — F17210 Nicotine dependence, cigarettes, uncomplicated: Secondary | ICD-10-CM | POA: Insufficient documentation

## 2020-02-07 LAB — TROPONIN I (HIGH SENSITIVITY)
Troponin I (High Sensitivity): 4 ng/L (ref <18)
Troponin I (High Sensitivity): 5 ng/L (ref <18)

## 2020-02-07 LAB — CBC
HCT: 36.9 % (ref 36.0–46.0)
Hemoglobin: 11 g/dL — ABNORMAL LOW (ref 12.0–15.0)
MCH: 23.1 pg — ABNORMAL LOW (ref 26.0–34.0)
MCHC: 29.8 g/dL — ABNORMAL LOW (ref 30.0–36.0)
MCV: 77.5 fL — ABNORMAL LOW (ref 80.0–100.0)
Platelets: 313 10*3/uL (ref 150–400)
RBC: 4.76 MIL/uL (ref 3.87–5.11)
RDW: 15.5 % (ref 11.5–15.5)
WBC: 5.1 10*3/uL (ref 4.0–10.5)
nRBC: 0 % (ref 0.0–0.2)

## 2020-02-07 LAB — BASIC METABOLIC PANEL
Anion gap: 12 (ref 5–15)
BUN: 12 mg/dL (ref 6–20)
CO2: 25 mmol/L (ref 22–32)
Calcium: 9.1 mg/dL (ref 8.9–10.3)
Chloride: 105 mmol/L (ref 98–111)
Creatinine, Ser: 0.78 mg/dL (ref 0.44–1.00)
GFR, Estimated: >60 mL/min (ref >60)
Glucose, Bld: 115 mg/dL — ABNORMAL HIGH (ref 70–99)
Potassium: 3.2 mmol/L — ABNORMAL LOW (ref 3.5–5.1)
Sodium: 142 mmol/L (ref 135–145)

## 2020-02-07 MED ORDER — AZITHROMYCIN 250 MG PO TABS: ORAL_TABLET | ORAL | 0 refills | Status: AC

## 2020-02-07 MED ORDER — PREDNISONE 50 MG PO TABS: 50.0000 mg | ORAL_TABLET | Freq: Every day | ORAL | 0 refills | Status: AC

## 2020-02-07 MED ORDER — GUAIFENESIN 200 MG PO TABS: 400.0000 mg | ORAL_TABLET | Freq: Four times a day (QID) | ORAL | 0 refills | Status: DC | PRN

## 2020-02-07 MED ORDER — ALBUTEROL SULFATE HFA 108 (90 BASE) MCG/ACT IN AERS: 2.0000 | INHALATION_SPRAY | RESPIRATORY_TRACT | 0 refills | Status: DC | PRN

## 2020-02-07 NOTE — ED Triage Notes (Signed)
Pt via POV from home. Pt has been having L sided chest pain, pt describes pain as sharp and burning that started Thursday. Nasal congestion, cough and SOB started on Tuesday. Pt tested for COVID on 1/25 and was negative but fiance tested positive. Denies NVD. Pt is A&Ox4 and NAD.

## 2020-02-07 NOTE — ED Provider Notes (Addendum)
[facility] Emergency Department Provider Note ____________________________________________   Event Date/Time   First MD Initiated Contact with Patient 02/07/20 1942     (approximate)  I have reviewed the triage vital signs and the nursing notes.   HISTORY  Chief Complaint Chest Pain, Nasal Congestion, and Shortness of Breath    HPI Tara Estes is a 30 y.o. female with PMH as noted below who presents with cough and nasal congestion over the last 4 days, persistent course, and now associated with left-sided chest pain over the last few days.  The pain is sharp and mainly occurs when she coughs.  She states that the cough is nonproductive.  She denies any fever or chills.  She has no vomiting or diarrhea.  She states that she was tested for COVID a few days ago and it was negative, however her significant other tested positive.  She has no leg pain or swelling and is not on OCPs.  Past Medical History:  Diagnosis Date  . Arthritis   . Bipolar 1 disorder ([place])    younger, no meds now  . Diabetes mellitus   . Endometriosis   . GERD (gastroesophageal reflux disease)   . Headache     Patient Active Problem List   Diagnosis Date Noted  . Post-operative state 04/02/2019  . Endometriosis   . Pelvic adhesive disease   . Tara/P laparoscopic surgery 03/07/2019    Past Surgical History:  Procedure Laterality Date  . CYSTOSCOPY W/ URETERAL STENT PLACEMENT Bilateral 04/02/2019   Procedure: CYSTOSCOPY WITH RETROGRADE PYELOGRAM/URETERAL STENT PLACEMENT;  Surgeon: Hollice Espy, MD;  Location: [facility];  Service: Urology;  Laterality: Bilateral;  . CYSTOSCOPY W/ URETERAL STENT REMOVAL Bilateral 04/02/2019   Procedure: CYSTOSCOPY WITH STENT REMOVAL;  Surgeon: Ward, Honor Loh, MD;  Location: [facility];  Service: Gynecology;  Laterality: Bilateral;  . head injury     requiring closure  . LAPAROSCOPY N/A 03/07/2019   Procedure: LAPAROSCOPY DIAGNOSTIC, lysis of  adhesions;  Surgeon: Ward, Honor Loh, MD;  Location: [facility];  Service: Gynecology;  Laterality: N/A;  . ROBOTIC ASSISTED LAPAROSCOPIC OVARIAN CYSTECTOMY Right 04/02/2019   Procedure: XI ROBOTIC ASSISTED LAPAROSCOPIC OVARIAN CYSTECTOMY;  Surgeon: Ward, Honor Loh, MD;  Location: [facility] OR[facility];  Service: Gynecology;  Laterality: Right;  . ROBOTIC ASSISTED TOTAL HYSTERECTOMY WITH BILATERAL SALPINGO OOPHERECTOMY Bilateral 04/02/2019   Procedure: XI ROBOTIC ASSISTED TOTAL HYSTERECTOMY WITH LEFT OOPHORECTOMY, BILATERAL SALPINGECTOMY, OVARIOLYSIS, ENTEROLYSIS,;  Surgeon: Ward, Honor Loh, MD;  Location: [facility];  Service: Gynecology;  Laterality: Bilateral;    Prior to Admission medications   Medication Sig Start Date End Date Taking? Authorizing Provider  albuterol (VENTOLIN HFA) 108 (90 Base) MCG/ACT inhaler Inhale 2 puffs into the lungs every 4 (four) hours as needed for wheezing or shortness of breath. 02/07/20  Yes Arta Silence, MD  azithromycin (ZITHROMAX Z-PAK) 250 MG tablet Take 2 tablets (500 mg) on  Day 1,  followed by 1 tablet (250 mg) once daily on Days 2 through 5. 02/07/20 02/12/20 Yes Arta Silence, MD  guaiFENesin 200 MG tablet Take 2 tablets (400 mg total) by mouth every 6 (six) hours as needed for cough or to loosen phlegm. 02/07/20  Yes Arta Silence, MD  predniSONE (DELTASONE) 50 MG tablet Take 1 tablet (50 mg total) by mouth daily with breakfast for 5 days. 02/07/20 02/12/20 Yes Arta Silence, MD  acetaminophen (TYLENOL) 500 MG tablet Take 1,000 mg by mouth every 6 (six) hours as needed.    [provider]  ibuprofen (ADVIL) 600 MG tablet Take 1 tablet (600 mg total) by mouth every 6 (six) hours. 04/02/19   Ward, Honor Loh, MD  oxyCODONE (ROXICODONE) 5 MG immediate release tablet Take 1 tablet (5 mg total) by mouth every 4 (four) hours as needed for severe pain. 04/02/19 04/01/20  Ward, Honor Loh, MD    Allergies Other  History reviewed. No pertinent family  history.  Social History Social History   Tobacco Use  . Smoking status: Current Every Day Smoker    Packs/day: 0.50    Types: Cigarettes  . Smokeless tobacco: Never Used  Vaping Use  . Vaping Use: Never used  Substance Use Topics  . Alcohol use: Yes    Comment: rarely  . Drug use: No    Review of Systems  Constitutional: No fever/chills Eyes: No visual changes. ENT: No sore throat. Cardiovascular: Positive for chest pain. Respiratory: Positive for cough and shortness of breath. Gastrointestinal: No vomiting or diarrhea.  Genitourinary: Negative for dysuria.  Musculoskeletal: Negative for back pain. Skin: Negative for rash. Neurological: Negative for headaches, focal weakness or numbness.   ____________________________________________   PHYSICAL EXAM:  VITAL SIGNS: ED Triage Vitals [02/07/20 1607]  Enc Vitals Group     BP 134/89     Pulse Rate 92     Resp 20     Temp 98.4 F (36.9 C)     Temp Source Oral     SpO2 100 %     Weight 277 lb 12.5 oz (126 kg)     Height 5\' 7"  (1.702 m)     Head Circumference      Peak Flow      Pain Score 10     Pain Loc      Pain Edu?      Excl. in [place]?     Constitutional: Alert and oriented. Well appearing and in no acute distress. Eyes: Conjunctivae are normal.  Head: Atraumatic. Nose: Nasal congestion. Mouth/Throat: Mucous membranes are moist.   Neck: Normal range of motion.  Cardiovascular: Normal rate, regular rhythm. Grossly normal heart sounds.  Good peripheral circulation. Respiratory: Normal respiratory effort.  No retractions.  Slightly prolonged expiratory phase but lungs CTAB. Gastrointestinal:  No distention.  Musculoskeletal:   Extremities warm and well perfused.  Neurologic:  Normal speech and language. No gross focal neurologic deficits are appreciated.  Skin:  Skin is warm and dry. No rash noted. Psychiatric: Mood and affect are normal. Speech and behavior are  normal.  ____________________________________________   LABS (all labs ordered are listed, but only abnormal results are displayed)  Labs Reviewed  BASIC METABOLIC PANEL - Abnormal; Notable for the following components:      Result Value   Potassium 3.2 (*)    Glucose, Bld 115 (*)    All other components within normal limits  CBC - Abnormal; Notable for the following components:   Hemoglobin 11.0 (*)    MCV 77.5 (*)    MCH 23.1 (*)    MCHC 29.8 (*)    All other components within normal limits  SARS CORONAVIRUS 2 (TAT 6-24 HRS)  POC URINE PREG, ED  TROPONIN I (HIGH SENSITIVITY)  TROPONIN I (HIGH SENSITIVITY)   ____________________________________________  EKG  ED ECG REPORT I, Arta Silence, the attending physician, personally viewed and interpreted this ECG.  Date: 02/08/2020 EKG Time: 1554 Rate: 90 Rhythm: normal sinus rhythm QRS Axis: normal Intervals: normal ST/T Wave abnormalities: Nonspecific T wave abnormalities Narrative Interpretation: Nonspecific  T wave abnormalities with no evidence of acute ischemia; similar appearance when compared to prior EKG of 11/01/2017  ____________________________________________  RADIOLOGY  Chest x-ray interpreted by me shows no focal infiltrate or edema ____________________________________________   PROCEDURES  Procedure(Tara) performed: No  Procedures  Critical Care performed: No ____________________________________________   INITIAL IMPRESSION / ASSESSMENT AND PLAN / ED COURSE  Pertinent labs & imaging results that were available during my care of the patient were reviewed by me and considered in my medical decision making (see chart for details).  30 year old female with PMH as noted above presents with cough and URI symptoms over the last several days associated with some shortness of breath and atypical chest pain which is mainly associated with cough.  Her partner tested positive for COVID-19 although she  tested negative several days ago.  On exam the patient is overall well-appearing.  The vital signs are normal.  O2 saturation is 100% on room air.  She has no increased work of breathing or respiratory distress.  She has a slightly prolonged expiratory phase but no wheezing or rhonchi.  Exam is otherwise unremarkable.  Differential includes COVID-19, other viral or bacterial bronchitis, pneumonia.  There is no evidence of cardiac cause.  This patient is low risk for ACS.  EKG shows nonspecific T wave abnormality similar to prior, and the initial troponin is negative.  She is PERC negative and there are no signs or symptoms to suggest DVT or PE, or aortic dissection or other vascular cause.  The chest pain appears to be musculoskeletal and related to coughing.  Chest x-ray shows no focal infiltrate.  We will obtain a repeat troponin and send a COVID PCR swab.  Plan will be for discharge home if the troponin is negative.  ----------------------------------------- 10:00 PM on 02/07/2020 -----------------------------------------  Repeat troponin was negative.  COVID swab has been sent.  I counseled the patient on the results of the work-up.  I will treat her for acute bronchitis with albuterol, steroid, and azithromycin.  This will also be the appropriate treatment if she ends up being positive for COVID which I think is highly likely.  Even if she is COVID-positive, given her lack of any oxygen requirement or respiratory distress she is appropriate for discharge home.  Return precautions given, and the patient expresses understanding.  ____________________________________________   FINAL CLINICAL IMPRESSION(Tara) / ED DIAGNOSES  Final diagnoses:  Acute bronchitis, unspecified organism      NEW MEDICATIONS STARTED DURING THIS VISIT:  Discharge Medication List as of 02/07/2020  9:21 PM    START taking these medications   Details  albuterol (VENTOLIN HFA) 108 (90 Base) MCG/ACT inhaler Inhale  2 puffs into the lungs every 4 (four) hours as needed for wheezing or shortness of breath., Starting Sat 02/07/2020, Normal    azithromycin (ZITHROMAX Z-PAK) 250 MG tablet Take 2 tablets (500 mg) on  Day 1,  followed by 1 tablet (250 mg) once daily on Days 2 through 5., Normal    guaiFENesin 200 MG tablet Take 2 tablets (400 mg total) by mouth every 6 (six) hours as needed for cough or to loosen phlegm., Starting Sat 02/07/2020, Normal    predniSONE (DELTASONE) 50 MG tablet Take 1 tablet (50 mg total) by mouth daily with breakfast for 5 days., Starting Sat 02/07/2020, Until Thu 02/12/2020, Normal         Note:  This document was prepared using Dragon voice recognition software and may include unintentional dictation errors.  Arta Silence, MD 02/08/20 [phone]

## 2020-02-07 NOTE — Discharge Instructions (Addendum)
Your COVID test will result tomorrow and you will be called with the result.  You should assume that you have COVID and isolate from others until the test result.  Take the antibiotic and prednisone as prescribed over the next 5 days.  You may take the guaifenesin as needed for cough and the albuterol inhaler for shortness of breath or chest tightness.  You can also take over-the-counter ibuprofen or Tylenol for pain.  If your COVID test is positive you will need to isolate for 5 days or until you have a negative rapid test result.

## 2020-02-08 LAB — SARS CORONAVIRUS 2 (TAT 6-24 HRS): SARS Coronavirus 2: POSITIVE — AB

## 2020-02-08 NOTE — ED Provider Notes (Incomplete)
[facility] Emergency Department Provider Note ____________________________________________   Event Date/Time   First MD Initiated Contact with Patient 02/07/20 1942     (approximate)  I have reviewed the triage vital signs and the nursing notes.   HISTORY  Chief Complaint Chest Pain, Nasal Congestion, and Shortness of Breath    HPI Tara Estes is a 30 y.o. female with PMH as noted below who presents with cough and nasal congestion over the last 4 days, persistent course, and now associated with left-sided chest pain over the last few days.  The pain is sharp and mainly occurs when she coughs.  She states that the cough is nonproductive.  She denies any fever or chills.  She has no vomiting or diarrhea.  She states that she was tested for COVID a few days ago and it was negative, however her significant other tested positive.  She has no leg pain or swelling and is not on OCPs.  Past Medical History:  Diagnosis Date  . Arthritis   . Bipolar 1 disorder ([place])    younger, no meds now  . Diabetes mellitus   . Endometriosis   . GERD (gastroesophageal reflux disease)   . Headache     Patient Active Problem List   Diagnosis Date Noted  . Post-operative state 04/02/2019  . Endometriosis   . Pelvic adhesive disease   . S/P laparoscopic surgery 03/07/2019    Past Surgical History:  Procedure Laterality Date  . CYSTOSCOPY W/ URETERAL STENT PLACEMENT Bilateral 04/02/2019   Procedure: CYSTOSCOPY WITH RETROGRADE PYELOGRAM/URETERAL STENT PLACEMENT;  Surgeon: Hollice Espy, MD;  Location: [facility] OR[facility];  Service: Urology;  Laterality: Bilateral;  . CYSTOSCOPY W/ URETERAL STENT REMOVAL Bilateral 04/02/2019   Procedure: CYSTOSCOPY WITH STENT REMOVAL;  Surgeon: Ward, Honor Loh, MD;  Location: [facility];  Service: Gynecology;  Laterality: Bilateral;  . head injury     requiring closure  . LAPAROSCOPY N/A 03/07/2019   Procedure: LAPAROSCOPY DIAGNOSTIC, lysis of  adhesions;  Surgeon: Ward, Honor Loh, MD;  Location: [facility];  Service: Gynecology;  Laterality: N/A;  . ROBOTIC ASSISTED LAPAROSCOPIC OVARIAN CYSTECTOMY Right 04/02/2019   Procedure: XI ROBOTIC ASSISTED LAPAROSCOPIC OVARIAN CYSTECTOMY;  Surgeon: Ward, Honor Loh, MD;  Location: [facility];  Service: Gynecology;  Laterality: Right;  . ROBOTIC ASSISTED TOTAL HYSTERECTOMY WITH BILATERAL SALPINGO OOPHERECTOMY Bilateral 04/02/2019   Procedure: XI ROBOTIC ASSISTED TOTAL HYSTERECTOMY WITH LEFT OOPHORECTOMY, BILATERAL SALPINGECTOMY, OVARIOLYSIS, ENTEROLYSIS,;  Surgeon: Ward, Honor Loh, MD;  Location: [facility];  Service: Gynecology;  Laterality: Bilateral;    Prior to Admission medications   Medication Sig Start Date End Date Taking? Authorizing Provider  acetaminophen (TYLENOL) 500 MG tablet Take 1,000 mg by mouth every 6 (six) hours as needed.    [provider]  ibuprofen (ADVIL) 600 MG tablet Take 1 tablet (600 mg total) by mouth every 6 (six) hours. 04/02/19   Ward, Honor Loh, MD  oxyCODONE (ROXICODONE) 5 MG immediate release tablet Take 1 tablet (5 mg total) by mouth every 4 (four) hours as needed for severe pain. 04/02/19 04/01/20  Ward, Honor Loh, MD    Allergies Other  History reviewed. No pertinent family history.  Social History Social History   Tobacco Use  . Smoking status: Current Every Day Smoker    Packs/day: 0.50    Types: Cigarettes  . Smokeless tobacco: Never Used  Vaping Use  . Vaping Use: Never used  Substance Use Topics  . Alcohol use: Yes    Comment:  rarely  . Drug use: No    Review of Systems  Constitutional: No fever/chills Eyes: No visual changes. ENT: No sore throat. Cardiovascular: Positive for chest pain. Respiratory: Positive for cough and shortness of breath. Gastrointestinal: No vomiting or diarrhea.  Genitourinary: Negative for dysuria.  Musculoskeletal: Negative for back pain. Skin: Negative for rash. Neurological: Negative for headaches,  focal weakness or numbness.   ____________________________________________   PHYSICAL EXAM:  VITAL SIGNS: ED Triage Vitals [02/07/20 1607]  Enc Vitals Group     BP 134/89     Pulse Rate 92     Resp 20     Temp 98.4 F (36.9 C)     Temp Source Oral     SpO2 100 %     Weight 277 lb 12.5 oz (126 kg)     Height 5\' 7"  (1.702 m)     Head Circumference      Peak Flow      Pain Score 10     Pain Loc      Pain Edu?      Excl. in [place]?     Constitutional: Alert and oriented. Well appearing and in no acute distress. Eyes: Conjunctivae are normal.  Head: Atraumatic. Nose: Nasal congestion. Mouth/Throat: Mucous membranes are moist.   Neck: Normal range of motion.  Cardiovascular: Normal rate, regular rhythm. Grossly normal heart sounds.  Good peripheral circulation. Respiratory: Normal respiratory effort.  No retractions.  Slightly prolonged expiratory phase but lungs CTAB. Gastrointestinal:  No distention.  Musculoskeletal:   Extremities warm and well perfused.  Neurologic:  Normal speech and language. No gross focal neurologic deficits are appreciated.  Skin:  Skin is warm and dry. No rash noted. Psychiatric: Mood and affect are normal. Speech and behavior are normal.  ____________________________________________   LABS (all labs ordered are listed, but only abnormal results are displayed)  Labs Reviewed  BASIC METABOLIC PANEL - Abnormal; Notable for the following components:      Result Value   Potassium 3.2 (*)    Glucose, Bld 115 (*)    All other components within normal limits  CBC - Abnormal; Notable for the following components:   Hemoglobin 11.0 (*)    MCV 77.5 (*)    MCH 23.1 (*)    MCHC 29.8 (*)    All other components within normal limits  SARS CORONAVIRUS 2 (TAT 6-24 HRS)  POC URINE PREG, ED  TROPONIN I (HIGH SENSITIVITY)  TROPONIN I (HIGH SENSITIVITY)   ____________________________________________  EKG  ED ECG REPORT I, Arta Silence, the  attending physician, personally viewed and interpreted this ECG.  Date: 02/08/2020 EKG Time: 1554 Rate: 90 Rhythm: normal sinus rhythm QRS Axis: normal Intervals: normal ST/T Wave abnormalities: Nonspecific T wave abnormalities Narrative Interpretation: Nonspecific T wave abnormalities with no evidence of acute ischemia; similar appearance when compared to prior EKG of 11/01/2017  ____________________________________________  RADIOLOGY  Chest x-ray interpreted by me shows no focal infiltrate or edema ____________________________________________   PROCEDURES  Procedure(s) performed: No  Procedures  Critical Care performed: ***No ____________________________________________   INITIAL IMPRESSION / ASSESSMENT AND PLAN / ED COURSE  Pertinent labs & imaging results that were available during my care of the patient were reviewed by me and considered in my medical decision making (see chart for details).  ***      ____________________________________________   FINAL CLINICAL IMPRESSION(S) / ED DIAGNOSES  Final diagnoses:  None      NEW MEDICATIONS STARTED DURING THIS VISIT:  New Prescriptions  No medications on file     Note:  This document was prepared using Dragon voice recognition software and may include unintentional dictation errors.

## 2020-04-09 ENCOUNTER — Emergency Department: Payer: Self-pay

## 2020-04-09 ENCOUNTER — Other Ambulatory Visit: Payer: Self-pay

## 2020-04-09 ENCOUNTER — Emergency Department
Admission: EM | Admit: 2020-04-09 | Discharge: 2020-04-09 | Disposition: A | Discharge status: Home / Self Care | Payer: Self-pay | Attending: Emergency Medicine | Admitting: Emergency Medicine

## 2020-04-09 DIAGNOSIS — F17210 Nicotine dependence, cigarettes, uncomplicated: Secondary | ICD-10-CM | POA: Insufficient documentation

## 2020-04-09 DIAGNOSIS — E119 Type 2 diabetes mellitus without complications: Secondary | ICD-10-CM | POA: Insufficient documentation

## 2020-04-09 DIAGNOSIS — M5412 Radiculopathy, cervical region: Secondary | ICD-10-CM | POA: Insufficient documentation

## 2020-04-09 MED ORDER — PREDNISONE 10 MG PO TABS: ORAL_TABLET | ORAL | 0 refills | Status: DC

## 2020-04-09 MED ORDER — TRAMADOL HCL 50 MG PO TABS: 50.0000 mg | ORAL_TABLET | Freq: Four times a day (QID) | ORAL | 0 refills | Status: DC | PRN

## 2020-04-09 MED ORDER — METHOCARBAMOL 500 MG PO TABS
1000.0000 mg | ORAL_TABLET | Freq: Once | ORAL | Status: AC
  Administered 2020-04-09: 1000 mg via ORAL
  Filled 2020-04-09: qty 2

## 2020-04-09 MED ORDER — METHOCARBAMOL 500 MG PO TABS: 500.0000 mg | ORAL_TABLET | Freq: Four times a day (QID) | ORAL | 0 refills | Status: DC | PRN

## 2020-04-09 MED ORDER — KETOROLAC TROMETHAMINE 30 MG/ML IJ SOLN
30.0000 mg | Freq: Once | INTRAMUSCULAR | Status: AC
  Administered 2020-04-09: 30 mg via INTRAMUSCULAR
  Filled 2020-04-09: qty 1

## 2020-04-09 NOTE — ED Triage Notes (Addendum)
Pt c/o upper back pain with pain radiating in to the right arm for the past wee and is having a hard time gripping or making a fist.

## 2020-04-09 NOTE — Discharge Instructions (Signed)
Follow-up with your primary care provider if any continued problems or concerns.  He may also follow-up with urgent care or return to the emergency department.  Begin taking Robaxin 1 tablet every 6 hours as needed for muscle spasms.  A prescription for prednisone was sent to the pharmacy which is 6 tablets the first day and tapering down to 1 tablet on the last day.  A prescription for tramadol was also sent as needed for pain.  You may use ice or heat to your neck as needed for discomfort.  Do not drive or operate machinery while taking the pain medication and the muscle relaxant as it could cause drowsiness and increase your risk for injury.

## 2020-04-09 NOTE — ED Provider Notes (Signed)
[facility] Emergency Department Provider Note   ____________________________________________   Event Date/Time   First MD Initiated Contact with Patient 04/09/20 1400     (approximate)  I have reviewed the triage vital signs and the nursing notes.   HISTORY  Chief Complaint Back Pain    HPI Tara Estes is a 30 y.o. female presents to the ED with complaint of cervical tenderness with decreased range of motion and right arm radiculopathy.  Patient denies any recent injury to her neck.  She has tried over-the-counter medication without any relief.  Rates her pain as an 8 out of 10.      Past Medical History:  Diagnosis Date   Arthritis    Bipolar 1 disorder ([place])    younger, no meds now   Diabetes mellitus    Endometriosis    GERD (gastroesophageal reflux disease)    Headache     Patient Active Problem List   Diagnosis Date Noted   Post-operative state 04/02/2019   Endometriosis    Pelvic adhesive disease    S/P laparoscopic surgery 03/07/2019    Past Surgical History:  Procedure Laterality Date   CYSTOSCOPY W/ URETERAL STENT PLACEMENT Bilateral 04/02/2019   Procedure: CYSTOSCOPY WITH RETROGRADE PYELOGRAM/URETERAL STENT PLACEMENT;  Surgeon: Hollice Espy, MD;  Location: [facility];  Service: Urology;  Laterality: Bilateral;   CYSTOSCOPY W/ URETERAL STENT REMOVAL Bilateral 04/02/2019   Procedure: CYSTOSCOPY WITH STENT REMOVAL;  Surgeon: Ward, Honor Loh, MD;  Location: [facility];  Service: Gynecology;  Laterality: Bilateral;   head injury     requiring closure   LAPAROSCOPY N/A 03/07/2019   Procedure: LAPAROSCOPY DIAGNOSTIC, lysis of adhesions;  Surgeon: Ward, Honor Loh, MD;  Location: [facility];  Service: Gynecology;  Laterality: N/A;   ROBOTIC ASSISTED LAPAROSCOPIC OVARIAN CYSTECTOMY Right 04/02/2019   Procedure: XI ROBOTIC ASSISTED LAPAROSCOPIC OVARIAN CYSTECTOMY;  Surgeon: Ward, Honor Loh, MD;  Location: [facility];  Service:  Gynecology;  Laterality: Right;   ROBOTIC ASSISTED TOTAL HYSTERECTOMY WITH BILATERAL SALPINGO OOPHERECTOMY Bilateral 04/02/2019   Procedure: XI ROBOTIC ASSISTED TOTAL HYSTERECTOMY WITH LEFT OOPHORECTOMY, BILATERAL SALPINGECTOMY, OVARIOLYSIS, ENTEROLYSIS,;  Surgeon: Ward, Honor Loh, MD;  Location: [facility];  Service: Gynecology;  Laterality: Bilateral;    Prior to Admission medications   Medication Sig Start Date End Date Taking? Authorizing Provider  methocarbamol (ROBAXIN) 500 MG tablet Take 1 tablet (500 mg total) by mouth every 6 (six) hours as needed. 04/09/20  Yes Johnn Hai, PA-C  predniSONE (DELTASONE) 10 MG tablet Take 6 tablets  today, on day 2 take 5 tablets, day 3 take 4 tablets, day 4 take 3 tablets, day 5 take  2 tablets and 1 tablet the last day 04/09/20  Yes Letitia Neri L, PA-C  traMADol (ULTRAM) 50 MG tablet Take 1 tablet (50 mg total) by mouth every 6 (six) hours as needed. 04/09/20  Yes Johnn Hai, PA-C  acetaminophen (TYLENOL) 500 MG tablet Take 1,000 mg by mouth every 6 (six) hours as needed.    [provider]  albuterol (VENTOLIN HFA) 108 (90 Base) MCG/ACT inhaler Inhale 2 puffs into the lungs every 4 (four) hours as needed for wheezing or shortness of breath. 02/07/20   Arta Silence, MD    Allergies Other  History reviewed. No pertinent family history.  Social History Social History   Tobacco Use   Smoking status: Current Every Day Smoker    Packs/day: 0.50    Types: Cigarettes   Smokeless tobacco: Never Used  Vaping Use   Vaping Use: Never used  Substance Use Topics   Alcohol use: Yes    Comment: rarely   Drug use: No    Review of Systems Constitutional: No fever/chills Eyes: No visual changes. ENT: No sore throat. Cardiovascular: Denies chest pain. Respiratory: Denies shortness of breath. Gastrointestinal: No abdominal pain.  No nausea, no vomiting.   Musculoskeletal: Positive for cervical pain with right arm  radiculopathy. Skin: Negative for rash. Neurological: Negative for headaches. ____________________________________________   PHYSICAL EXAM:  VITAL SIGNS: ED Triage Vitals  Enc Vitals Group     BP 04/09/20 1320 (!) 149/89     Pulse Rate 04/09/20 1320 83     Resp 04/09/20 1320 17     Temp --      Temp src --      SpO2 04/09/20 1320 99 %     Weight 04/09/20 1320 (!) 304 lb (137.9 kg)     Height 04/09/20 1320 5\' 7"  (1.702 m)     Head Circumference --      Peak Flow --      Pain Score 04/09/20 1320 8     Pain Loc --      Pain Edu? --      Excl. in [place]? --     Constitutional: Alert and oriented. Well appearing and in no acute distress. Eyes: Conjunctivae are normal. PERRL. EOMI. Head: Atraumatic. Nose: No congestion/rhinnorhea. Neck: No stridor.  There is some tenderness on palpation of cervical spine at C4-C5 and C5-C6.  Range of motion is slow and guarded laterally secondary to discomfort.  There is moderate tenderness on palpation of the right trapezius muscle.  Patient is able to flex and extend her neck without difficulty. Cardiovascular: Normal rate, regular rhythm. Grossly normal heart sounds.  Good peripheral circulation. Respiratory: Normal respiratory effort.  No retractions. Lungs CTAB. Musculoskeletal: Moves upper and lower extremities without any difficulty.  Grip strength in the right hand is slightly decreased in comparison to the left hand.  Otherwise good muscle strength.  Patient is ambulatory without any assistance. Neurologic:  Normal speech and language. No gross focal neurologic deficits are appreciated. No gait instability. Skin:  Skin is warm, dry and intact. No rash noted. Psychiatric: Mood and affect are normal. Speech and behavior are normal.  ____________________________________________   LABS (all labs ordered are listed, but only abnormal results are displayed)  Labs Reviewed - No data to  display ____________________________________________  RADIOLOGY I, Johnn Hai, personally viewed and evaluated these images (plain radiographs) as part of my medical decision making, as well as reviewing the written report by the radiologist.   Official radiology report(s): DG Cervical Spine 2-3 Views  Result Date: 04/09/2020 CLINICAL DATA:  Pain for 5 days with RIGHT arm radiculopathy EXAM: CERVICAL SPINE - 2-3 VIEW COMPARISON:  None FINDINGS: Reversal of cervical lordosis question muscle spasm. Fever body and disc space heights maintained. Prevertebral soft tissues normal thickness. No fracture, subluxation, or bone destruction. Slight lateral cervical flexion to the RIGHT. C1-C2 alignment normal. Lung apices clear. IMPRESSION: Question muscle spasm; otherwise normal exam. Electronically Signed   By: Lavonia Dana M.D.   On: 04/09/2020 15:16    ____________________________________________   PROCEDURES  Procedure(s) performed (including Critical Care):  Procedures   ____________________________________________   INITIAL IMPRESSION / ASSESSMENT AND PLAN / ED COURSE  As part of my medical decision making, I reviewed the following data within the electronic MEDICAL RECORD NUMBER Notes from prior ED visits and  [place] Controlled Substance Database  30 year old female presents to the ED with complaint of cervical pain and radiculopathy into her right arm for the last 5 days.  Patient denies any previous injury to her neck.  She is taken over-the-counter medication without any relief.  There is tenderness on palpation of the base of the cervical spine.  Grip strength in the right hand is less than on the left in comparison secondary to discomfort.  X-rays were negative with the exception of findings consistent with muscle spasms.  Patient was made aware.  She was given methocarbamol 1000 mg p.o. and Toradol 30 mg IM while in the ED and improved somewhat.  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  Patient was discharged with a prescription for methocarbamol every 6 hours, tramadol every 6 hours as needed for pain and a tapering dose of prednisone beginning with 60 mg.  She is encouraged to use ice or heat to her neck as needed for discomfort.  She is to follow-up with her PCP if any continued problems or return to the emergency department if any severe worsening of her symptoms.  ____________________________________________   FINAL CLINICAL IMPRESSION(S) / ED DIAGNOSES  Final diagnoses:  Cervical radicular pain     ED Discharge Orders         Ordered    methocarbamol (ROBAXIN) 500 MG tablet  Every 6 hours PRN        04/09/20 1540    traMADol (ULTRAM) 50 MG tablet  Every 6 hours PRN        04/09/20 1540    predniSONE (DELTASONE) 10 MG tablet        04/09/20 1540          *Please note:  Tara Estes was evaluated in Emergency Department on 04/09/2020 for the symptoms described in the history of present illness. She was evaluated in the context of the global COVID-19 pandemic, which necessitated consideration that the patient might be at risk for infection with the SARS-CoV-2 virus that causes COVID-19. Institutional protocols and algorithms that pertain to the evaluation of patients at risk for COVID-19 are in a state of rapid change based on information released by regulatory bodies including the CDC and federal and state organizations. These policies and algorithms were followed during the patient's care in the ED.  Some ED evaluations and interventions may be delayed as a result of limited staffing during and the pandemic.*   Note:  This document was prepared using Dragon voice recognition software and may include unintentional dictation errors.    Johnn Hai, PA-C 04/09/20 1550    Vanessa LaGr[place], MD 04/10/20 [phone]

## 2021-01-04 ENCOUNTER — Ambulatory Visit
Admission: EM | Admit: 2021-01-04 | Discharge: 2021-01-04 | Disposition: A | Discharge status: Home / Self Care | Payer: No Typology Code available for payment source | Attending: Internal Medicine | Admitting: Internal Medicine

## 2021-01-04 ENCOUNTER — Other Ambulatory Visit: Payer: Self-pay

## 2021-01-04 DIAGNOSIS — M542 Cervicalgia: Secondary | ICD-10-CM

## 2021-01-04 DIAGNOSIS — I10 Essential (primary) hypertension: Secondary | ICD-10-CM

## 2021-01-04 DIAGNOSIS — M549 Dorsalgia, unspecified: Secondary | ICD-10-CM

## 2021-01-04 DIAGNOSIS — R519 Headache, unspecified: Secondary | ICD-10-CM | POA: Insufficient documentation

## 2021-01-04 LAB — CBC WITH DIFFERENTIAL/PLATELET
Abs Immature Granulocytes: 0.04 10*3/uL (ref 0.00–0.07)
Basophils Absolute: 0 10*3/uL (ref 0.0–0.1)
Basophils Relative: 0 %
Eosinophils Absolute: 0.1 10*3/uL (ref 0.0–0.5)
Eosinophils Relative: 2 %
HCT: 39.3 % (ref 36.0–46.0)
Hemoglobin: 12.1 g/dL (ref 12.0–15.0)
Immature Granulocytes: 1 %
Lymphocytes Relative: 50 %
Lymphs Abs: 4.2 10*3/uL — ABNORMAL HIGH (ref 0.7–4.0)
MCH: 24.8 pg — ABNORMAL LOW (ref 26.0–34.0)
MCHC: 30.8 g/dL (ref 30.0–36.0)
MCV: 80.5 fL (ref 80.0–100.0)
Monocytes Absolute: 0.5 10*3/uL (ref 0.1–1.0)
Monocytes Relative: 6 %
Neutro Abs: 3.4 10*3/uL (ref 1.7–7.7)
Neutrophils Relative %: 41 %
Platelets: 355 10*3/uL (ref 150–400)
RBC: 4.88 MIL/uL (ref 3.87–5.11)
RDW: 14.3 % (ref 11.5–15.5)
WBC: 8.3 10*3/uL (ref 4.0–10.5)
nRBC: 0 % (ref 0.0–0.2)

## 2021-01-04 LAB — COMPREHENSIVE METABOLIC PANEL
ALT: 22 U/L (ref 0–44)
AST: 18 U/L (ref 15–41)
Albumin: 3.8 g/dL (ref 3.5–5.0)
Alkaline Phosphatase: 102 U/L (ref 38–126)
Anion gap: 6 (ref 5–15)
BUN: 10 mg/dL (ref 6–20)
CO2: 26 mmol/L (ref 22–32)
Calcium: 9 mg/dL (ref 8.9–10.3)
Chloride: 105 mmol/L (ref 98–111)
Creatinine, Ser: 0.7 mg/dL (ref 0.44–1.00)
GFR, Estimated: >60 mL/min (ref >60)
Glucose, Bld: 101 mg/dL — ABNORMAL HIGH (ref 70–99)
Potassium: 3.5 mmol/L (ref 3.5–5.1)
Sodium: 137 mmol/L (ref 135–145)
Total Bilirubin: 0.6 mg/dL (ref 0.3–1.2)
Total Protein: 8.1 g/dL (ref 6.5–8.1)

## 2021-01-04 MED ORDER — KETOROLAC TROMETHAMINE 60 MG/2ML IM SOLN
60.0000 mg | Freq: Once | INTRAMUSCULAR | Status: AC
  Administered 2021-01-04: 11:00:00 60 mg via INTRAMUSCULAR

## 2021-01-04 MED ORDER — TRIAMTERENE-HCTZ 37.5-25 MG PO CAPS: 1.0000 | ORAL_CAPSULE | Freq: Every day | ORAL | 1 refills | Status: DC

## 2021-01-04 NOTE — ED Triage Notes (Signed)
Patient is here for "High BP, Extremely". Seen for this by ED/UC in past. Still having Ha's. Sent home last night from work due to High BP "157/112".

## 2021-01-04 NOTE — ED Provider Notes (Signed)
[facility]    CSN: [id number] Arrival date & time: 01/04/21  0917      History   Chief Complaint Chief Complaint  Patient presents with   Hypertension    HPI Tara Estes is a 30 y.o. female.  She presents today after observing several elevated blood pressures in the last month or so.  She has also had headache on and off for the last couple of weeks.  No gait disturbance, moving arms and legs all right, no unusual clumsiness.  Not particularly short of breath, no unusual leg swelling.  Does not really feel like she is coming down with a respiratory bug.  The headache is described as pain in the back of her head that radiates into her neck and shoulder, this is been described on previous visits as well.  She works [employer] and thinks that probably the upper back/neck pain (radiating into the back of the head) are occupationally related.  Most worried about the blood pressure; her father has had heart attacks and has heart failure.  HPI  Past Medical History:  Diagnosis Date   Arthritis    Bipolar 1 disorder ([place])    younger, no meds now   Diabetes mellitus    Endometriosis    GERD (gastroesophageal reflux disease)    Headache     Patient Active Problem List   Diagnosis Date Noted   Post-operative state 04/02/2019   Endometriosis    Pelvic adhesive disease    S/P laparoscopic surgery 03/07/2019    Past Surgical History:  Procedure Laterality Date   CYSTOSCOPY W/ URETERAL STENT PLACEMENT Bilateral 04/02/2019   Procedure: CYSTOSCOPY WITH RETROGRADE PYELOGRAM/URETERAL STENT PLACEMENT;  Surgeon: Hollice Espy, MD;  Location: [facility];  Service: Urology;  Laterality: Bilateral;   CYSTOSCOPY W/ URETERAL STENT REMOVAL Bilateral 04/02/2019   Procedure: CYSTOSCOPY WITH STENT REMOVAL;  Surgeon: Ward, Honor Loh, MD;  Location: [facility];  Service: Gynecology;  Laterality: Bilateral;   head injury     requiring closure   LAPAROSCOPY N/A 03/07/2019   Procedure:  LAPAROSCOPY DIAGNOSTIC, lysis of adhesions;  Surgeon: Ward, Honor Loh, MD;  Location: [facility];  Service: Gynecology;  Laterality: N/A;   ROBOTIC ASSISTED LAPAROSCOPIC OVARIAN CYSTECTOMY Right 04/02/2019   Procedure: XI ROBOTIC ASSISTED LAPAROSCOPIC OVARIAN CYSTECTOMY;  Surgeon: Ward, Honor Loh, MD;  Location: [facility];  Service: Gynecology;  Laterality: Right;   ROBOTIC ASSISTED TOTAL HYSTERECTOMY WITH BILATERAL SALPINGO OOPHERECTOMY Bilateral 04/02/2019   Procedure: XI ROBOTIC ASSISTED TOTAL HYSTERECTOMY WITH LEFT OOPHORECTOMY, BILATERAL SALPINGECTOMY, OVARIOLYSIS, ENTEROLYSIS,;  Surgeon: Ward, Honor Loh, MD;  Location: [facility];  Service: Gynecology;  Laterality: Bilateral;     Home Medications    Prior to Admission medications   Medication Sig Start Date End Date Taking? Authorizing Provider  etonogestrel (NEXPLANON) 68 MG IMPL implant 1 each by Subdermal route once.   Yes [provider]  triamterene-hydrochlorothiazide (DYAZIDE) 37.5-25 MG capsule Take 1 each (1 capsule total) by mouth daily. 01/04/21 03/05/21 Yes Wynona Luna, MD  acetaminophen (TYLENOL) 500 MG tablet Take 1,000 mg by mouth every 6 (six) hours as needed.    [provider]  albuterol (VENTOLIN HFA) 108 (90 Base) MCG/ACT inhaler Inhale 2 puffs into the lungs every 4 (four) hours as needed for wheezing or shortness of breath. 02/07/20   Arta Silence, MD  methocarbamol (ROBAXIN) 500 MG tablet Take 1 tablet (500 mg total) by mouth every 6 (six) hours as needed. 04/09/20   Johnn Hai, PA-C  predniSONE (DELTASONE) 10 MG tablet Take 6 tablets  today, on day 2 take 5 tablets, day 3 take 4 tablets, day 4 take 3 tablets, day 5 take  2 tablets and 1 tablet the last day 04/09/20   Johnn Hai, PA-C  traMADol (ULTRAM) 50 MG tablet Take 1 tablet (50 mg total) by mouth every 6 (six) hours as needed. 04/09/20   Johnn Hai, PA-C    Family History No family history on file. Father has had  heart attacks, and has heart failure with an ejection fraction of 15 to 20%. Other family members have hypertension and diabetes.  Social History Social History   Tobacco Use   Smoking status: Some Days    Packs/day: 0.50    Types: Cigarettes   Smokeless tobacco: Never  Vaping Use   Vaping Use: Never used  Substance Use Topics   Alcohol use: Yes    Comment: rarely   Drug use: No     Allergies   Other   Review of Systems Review of Systems see HPI   Physical Exam Triage Vital Signs ED Triage Vitals  Enc Vitals Group     BP 01/04/21 1012 (!) 179/105     Pulse Rate 01/04/21 1012 95     Resp 01/04/21 1012 (!) 22     Temp 01/04/21 1012 98.6 F (37 C)     Temp Source 01/04/21 1012 Oral     SpO2 --      Weight 01/04/21 1011 (!) 350 lb (158.8 kg)     Height --      Pain Score 01/04/21 1011 0     Pain Loc --     Updated Vital Signs BP (!) 179/105 (BP Location: Left Arm)    Pulse 95    Temp 98.6 F (37 C) (Oral)    Resp (!) 22    Wt (!) 158.8 kg    LMP 02/27/2019    BMI 54.82 kg/m   Physical Exam Constitutional:      General: She is not in acute distress.    Appearance: She is not ill-appearing or toxic-appearing.     Comments: Nicely groomed  HENT:     Head:     Comments: Bilateral TMs are unremarkable Mild nasal congestion bilaterally Throat is pink    Mouth/Throat:     Mouth: Mucous membranes are moist.  Eyes:     Conjunctiva/sclera:     Right eye: Right conjunctiva is not injected. No exudate.    Left eye: Left conjunctiva is not injected. No exudate.    Comments: Conjugate gaze observed  Cardiovascular:     Rate and Rhythm: Normal rate and regular rhythm.  Pulmonary:     Effort: Pulmonary effort is normal. No respiratory distress.     Breath sounds: No wheezing or rhonchi.  Abdominal:     Comments: Protuberant  Musculoskeletal:        General: No swelling.     Cervical back: Neck supple.     Comments: Able to walk into the urgent care  independently, and climb on/off the exam table without assistance  Skin:    General: Skin is warm and dry.     Comments: No cyanosis  Neurological:     Mental Status: She is alert.     Comments: Face is symmetric, speech is clear, coherent, and logical     UC Treatments / Results  Labs (all labs ordered are listed, but only abnormal results are displayed) Labs  Reviewed  CBC WITH DIFFERENTIAL/PLATELET - Abnormal; Notable for the following components:      Result Value   MCH 24.8 (*)    Lymphs Abs 4.2 (*)    All other components within normal limits  COMPREHENSIVE METABOLIC PANEL - Abnormal; Notable for the following components:   Glucose, Bld 101 (*)    All other components within normal limits    EKG N/A  Radiology No results found. No imaging done at urgent care visit  Procedures Procedures (including critical care time) N/A  Medications Ordered in UC Medications  ketorolac (TORADOL) injection 60 mg (60 mg Intramuscular Given 01/04/21 1128)    Final Clinical Impressions(s) / UC Diagnoses   Final diagnoses:  Essential hypertension  Acute nonintractable headache, unspecified headache type  Upper back pain  Neck pain     Discharge Instructions      Blood pressure was elevated at urgent care today and has been on several prior occasions.  A blood pressure medicine, triamterene-HCTZ, was sent to the pharmacy.  Please keep your followup appointment with Dr Almyra Brace to recheck blood pressure in about 6 weeks.   Labs done today included blood counts, liver and kidney tests, and blood chemistries.  There were no significant abnormalities observed. An injection of ketorolac (anti inflammatory/pain reliever) was given at the urgent care today to help with headache and neck/upper back pain.  Physical therapy may be additionally helpful in the long term to help manage pain.       ED Prescriptions     Medication Sig Dispense Auth. Provider    triamterene-hydrochlorothiazide (DYAZIDE) 37.5-25 MG capsule Take 1 each (1 capsule total) by mouth daily. 30 capsule Wynona Luna, MD      PDMP not reviewed this encounter.   Wynona Luna, MD 01/05/21 0930

## 2021-01-04 NOTE — Discharge Instructions (Addendum)
Blood pressure was elevated at urgent care today and has been on several prior occasions.  A blood pressure medicine, triamterene-HCTZ, was sent to the pharmacy.  Please keep your followup appointment with Dr Almyra Brace to recheck blood pressure in about 6 weeks.   Labs done today included blood counts, liver and kidney tests, and blood chemistries.  There were no significant abnormalities observed. An injection of ketorolac (anti inflammatory/pain reliever) was given at the urgent care today to help with headache and neck/upper back pain.  Physical therapy may be additionally helpful in the long term to help manage pain.

## 2021-01-27 ENCOUNTER — Ambulatory Visit (INDEPENDENT_AMBULATORY_CARE_PROVIDER_SITE_OTHER): Payer: No Typology Code available for payment source | Admitting: Physician Assistant

## 2021-01-27 ENCOUNTER — Telehealth: Payer: Self-pay

## 2021-01-27 ENCOUNTER — Other Ambulatory Visit: Payer: Self-pay

## 2021-01-27 ENCOUNTER — Encounter: Payer: Self-pay | Admitting: Physician Assistant

## 2021-01-27 DIAGNOSIS — R5383 Other fatigue: Secondary | ICD-10-CM

## 2021-01-27 DIAGNOSIS — Z6841 Body Mass Index (BMI) 40.0 and over, adult: Secondary | ICD-10-CM

## 2021-01-27 DIAGNOSIS — G4710 Hypersomnia, unspecified: Secondary | ICD-10-CM | POA: Diagnosis not present

## 2021-01-27 DIAGNOSIS — Z7689 Persons encountering health services in other specified circumstances: Secondary | ICD-10-CM

## 2021-01-27 DIAGNOSIS — M2550 Pain in unspecified joint: Secondary | ICD-10-CM | POA: Diagnosis not present

## 2021-01-27 DIAGNOSIS — I10 Essential (primary) hypertension: Secondary | ICD-10-CM

## 2021-01-27 MED ORDER — AMLODIPINE BESYLATE 5 MG PO TABS: 5.0000 mg | ORAL_TABLET | Freq: Every day | ORAL | 0 refills | Status: DC

## 2021-01-27 NOTE — Telephone Encounter (Signed)
Sleep study ordered. Printed order, gave to Tat-Toni

## 2021-01-27 NOTE — Progress Notes (Signed)
[facility] [place], [place] [address]  Internal MEDICINE  Office Visit Note  Patient Name: Tara Estes  [id number]  [id number]  Date of Service: 01/31/2021   Complaints/HPI Pt is here for establishment of PCP. Chief Complaint  Patient presents with   New Patient (Initial Visit)   Hypertension    Has had very high BP levels in the past, has been sent to ED for them    HPI Pt is here to establish care. She is here with her fiance, Tara Estes. -She is coming in at the urging of her fiance and her mother who is a patient here, due to high BP that has sent her to ED previously. -She states her BP will sometimes be 200/100 BP at home, and has had some blurred vision, headaches, and eye twitching with them. -When she went to the ED they started her on triamterene-hctz and has continued on this but her BP is still high -She works third shift, works [employer] at the [employer] in 2021 after bleeding due to ovaries twisted into intestines, then had a hysterectomy and has one ovary left. Started nexplanon to help with cyst in remaining ovary. Nexplanon placed 02/18/20, has not been back to OBGYN since.  -Does have arthritis, has had this since [age]yo.  -Currently smokes 4-5cigs per day. Has been smoking since 20. Does vape as well -Seldom alcohol use, maybe 2 glasses per year. No other substance use. -Walks 20,000 steps at work, will take dogs for a walk. Mainly home cooked meals. -Snores, has witnessed apneas, startles and gasps awake. Never feels refreshed. Discussed an HST since she works night shift and would have difficulty changing schedule and cant sleep in new places.  Current Medication: Outpatient Encounter Medications as of 01/27/2021  Medication Sig   acetaminophen (TYLENOL) 500 MG tablet Take 1,000 mg by mouth every 6 (six) hours as needed.   albuterol (VENTOLIN HFA) 108 (90 Base) MCG/ACT inhaler Inhale 2 puffs into the lungs every 4 (four) hours  as needed for wheezing or shortness of breath.   amLODipine (NORVASC) 5 MG tablet Take 1 tablet (5 mg total) by mouth daily.   etonogestrel (NEXPLANON) 68 MG IMPL implant 1 each by Subdermal route once.   methocarbamol (ROBAXIN) 500 MG tablet Take 1 tablet (500 mg total) by mouth every 6 (six) hours as needed.   [DISCONTINUED] predniSONE (DELTASONE) 10 MG tablet Take 6 tablets  today, on day 2 take 5 tablets, day 3 take 4 tablets, day 4 take 3 tablets, day 5 take  2 tablets and 1 tablet the last day (Patient not taking: Reported on 01/27/2021)   [DISCONTINUED] traMADol (ULTRAM) 50 MG tablet Take 1 tablet (50 mg total) by mouth every 6 (six) hours as needed. (Patient not taking: Reported on 01/27/2021)   [DISCONTINUED] triamterene-hydrochlorothiazide (DYAZIDE) 37.5-25 MG capsule Take 1 each (1 capsule total) by mouth daily. (Patient not taking: Reported on 01/27/2021)   No facility-administered encounter medications on file as of 01/27/2021.    Surgical History: Past Surgical History:  Procedure Laterality Date   CYSTOSCOPY W/ URETERAL STENT PLACEMENT Bilateral 04/02/2019   Procedure: CYSTOSCOPY WITH RETROGRADE PYELOGRAM/URETERAL STENT PLACEMENT;  Surgeon: Hollice Espy, MD;  Location: [facility];  Service: Urology;  Laterality: Bilateral;   CYSTOSCOPY W/ URETERAL STENT REMOVAL Bilateral 04/02/2019   Procedure: CYSTOSCOPY WITH STENT REMOVAL;  Surgeon: Ward, Honor Loh, MD;  Location: [facility];  Service: Gynecology;  Laterality: Bilateral;   head injury  requiring closure   LAPAROSCOPY N/A 03/07/2019   Procedure: LAPAROSCOPY DIAGNOSTIC, lysis of adhesions;  Surgeon: Ward, Honor Loh, MD;  Location: [facility];  Service: Gynecology;  Laterality: N/A;   ROBOTIC ASSISTED LAPAROSCOPIC OVARIAN CYSTECTOMY Right 04/02/2019   Procedure: XI ROBOTIC ASSISTED LAPAROSCOPIC OVARIAN CYSTECTOMY;  Surgeon: Ward, Honor Loh, MD;  Location: [facility];  Service: Gynecology;  Laterality: Right;   ROBOTIC ASSISTED TOTAL  HYSTERECTOMY WITH BILATERAL SALPINGO OOPHERECTOMY Bilateral 04/02/2019   Procedure: XI ROBOTIC ASSISTED TOTAL HYSTERECTOMY WITH LEFT OOPHORECTOMY, BILATERAL SALPINGECTOMY, OVARIOLYSIS, ENTEROLYSIS,;  Surgeon: Ward, Honor Loh, MD;  Location: [facility];  Service: Gynecology;  Laterality: Bilateral;    Medical History: Past Medical History:  Diagnosis Date   Arthritis    Bipolar 1 disorder ([place])    younger, no meds now   Diabetes mellitus    Endometriosis    GERD (gastroesophageal reflux disease)    Headache     Family History: Family History  Problem Relation Age of Onset   Hypertension Mother    Hypertension Father    Heart disease Father     Social History   Socioeconomic History   Marital status: Single    Spouse name: Not on file   Number of children: Not on file   Years of education: Not on file   Highest education level: Not on file  Occupational History   Not on file  Tobacco Use   Smoking status: Some Days    Types: Cigarettes   Smokeless tobacco: Never   Tobacco comments:    4-5 cigarettes a day  Vaping Use   Vaping Use: Some days  Substance and Sexual Activity   Alcohol use: Yes    Comment: rarely   Drug use: No   Sexual activity: Never  Other Topics Concern   Not on file  Social History Narrative   Not on file   Social Determinants of Health   Financial Resource Strain: Not on file  Food Insecurity: Not on file  Transportation Needs: Not on file  Physical Activity: Not on file  Stress: Not on file  Social Connections: Not on file  Intimate Partner Violence: Not on file     Review of Systems  Constitutional:  Positive for fatigue. Negative for chills and unexpected weight change.  HENT:  Negative for congestion, postnasal drip, rhinorrhea, sneezing and sore throat.   Eyes:  Positive for visual disturbance. Negative for redness.  Respiratory:  Positive for apnea. Negative for cough, chest tightness and shortness of breath.   Cardiovascular:   Negative for chest pain and palpitations.  Gastrointestinal:  Negative for abdominal pain, constipation, diarrhea, nausea and vomiting.  Genitourinary:  Negative for dysuria and frequency.  Musculoskeletal:  Positive for arthralgias. Negative for back pain, joint swelling and neck pain.  Skin:  Negative for rash.  Neurological:  Positive for headaches. Negative for tremors and numbness.  Hematological:  Negative for adenopathy. Does not bruise/bleed easily.  Psychiatric/Behavioral:  Positive for sleep disturbance. Negative for behavioral problems (Depression) and suicidal ideas. The patient is nervous/anxious.    Vital Signs: BP (!) 151/95    Pulse 94    Temp 97.7 F (36.5 C)    Resp 16    Ht 5\' 7"  (1.702 m)    Wt (!) 336 lb 6.4 oz (152.6 kg)    LMP 02/27/2019    SpO2 98%    BMI 52.69 kg/m    Physical Exam Vitals and nursing note reviewed.  Constitutional:  General: She is not in acute distress.    Appearance: She is well-developed. She is obese. She is not diaphoretic.  HENT:     Head: Normocephalic and atraumatic.     Mouth/Throat:     Pharynx: No oropharyngeal exudate.  Eyes:     Pupils: Pupils are equal, round, and reactive to light.  Neck:     Thyroid: No thyromegaly.     Vascular: No JVD.     Trachea: No tracheal deviation.  Cardiovascular:     Rate and Rhythm: Normal rate and regular rhythm.     Heart sounds: Normal heart sounds. No murmur heard.   No friction rub. No gallop.  Pulmonary:     Effort: Pulmonary effort is normal. No respiratory distress.     Breath sounds: No wheezing or rales.  Chest:     Chest wall: No tenderness.  Abdominal:     General: Bowel sounds are normal.     Palpations: Abdomen is soft.  Musculoskeletal:        General: Normal range of motion.     Cervical back: Normal range of motion and neck supple.  Lymphadenopathy:     Cervical: No cervical adenopathy.  Skin:    General: Skin is warm and dry.  Neurological:     Mental Status:  She is alert and oriented to person, place, and time.     Cranial Nerves: No cranial nerve deficit.  Psychiatric:        Behavior: Behavior normal.        Thought Content: Thought content normal.        Judgment: Judgment normal.      Assessment/Plan: 1. Essential (primary) hypertension Will continue triamterenee-hctz and add amlodipinee. Will continue to monitor and may need to titrate up. Will also order sleep study. - amLODipine (NORVASC) 5 MG tablet; Take 1 tablet (5 mg total) by mouth daily.  Dispense: 90 tablet; Refill: 0  2. Hypersomnia Based on snoring, fatigue, choking/gasping awake, witnessed apneas, HTN, and elevated BMI will order HST to evaluate - Home sleep test  3. Arthralgia, unspecified joint - Rheumatoid Factor - ANA w/Reflex if Positive - Sedimentation rate  4. Morbid obesity with BMI of 50.0-59.9, adult ([place]) Will order SS Obesity Counseling: Had a lengthy discussion regarding patients BMI and weight issues. Patient was instructed on portion control as well as increased activity. Also discussed caloric restrictions with trying to maintain intake less than 2000 Kcal. Discussions were made in accordance with the 5As of weight management. Simple actions such as not eating late and if able to, taking a walk is suggested.  5. Encounter to establish care with new doctor Ordered routine fasting labs - CBC w/Diff/Platelet - Comprehensive metabolic panel - Lipid Panel With LDL/HDL Ratio - TSH + free T4 - Rheumatoid Factor - ANA w/Reflex if Positive - Sedimentation rate  6. Other fatigue - CBC w/Diff/Platelet - Comprehensive metabolic panel - Lipid Panel With LDL/HDL Ratio - TSH + free T4 - Rheumatoid Factor - ANA w/Reflex if Positive - Sedimentation rate   General Counseling: Amiracle verbalizes understanding of the findings of todays visit and agrees with plan of treatment. I have discussed any further diagnostic evaluation that may be needed or ordered  today. We also reviewed her medications today. she has been encouraged to call the office with any questions or concerns that should arise related to todays visit.    Counseling:  Hypertension Counseling:   The following hypertensive lifestyle modification were recommended and  discussed:  1. Limiting alcohol intake to less than 1 oz/day of ethanol:(24 oz of beer or 8 oz of wine or 2 oz of 100-proof whiskey). 2. Take baby ASA 81 mg daily. 3. Importance of regular aerobic exercise and losing weight. 4. Reduce dietary saturated fat and cholesterol intake for overall cardiovascular health. 5. Maintaining adequate dietary potassium, calcium, and magnesium intake. 6. Regular monitoring of the blood pressure. 7. Reduce sodium intake to less than 100 mmol/day (less than 2.3 gm of sodium or less than 6 gm of sodium choride)    Orders Placed This Encounter  Procedures   CBC w/Diff/Platelet   Comprehensive metabolic panel   Lipid Panel With LDL/HDL Ratio   TSH + free T4   Rheumatoid Factor   ANA w/Reflex if Positive   Sedimentation rate   Home sleep test    Meds ordered this encounter  Medications   amLODipine (NORVASC) 5 MG tablet    Sig: Take 1 tablet (5 mg total) by mouth daily.    Dispense:  90 tablet    Refill:  0     This patient was seen by Drema Dallas, PA-C in collaboration with Dr. Clayborn Bigness as a part of collaborative care agreement.   Time spent:40 Minutes

## 2021-02-04 ENCOUNTER — Telehealth: Payer: Self-pay

## 2021-02-04 LAB — LIPID PANEL WITH LDL/HDL RATIO
Cholesterol, Total: 131 mg/dL (ref 100–199)
HDL: 40 mg/dL (ref >39)
LDL Chol Calc (NIH): 68 mg/dL (ref 0–99)
LDL/HDL Ratio: 1.7 ratio (ref 0.0–3.2)
Triglycerides: 129 mg/dL (ref 0–149)
VLDL Cholesterol Cal: 23 mg/dL (ref 5–40)

## 2021-02-04 LAB — CBC WITH DIFFERENTIAL/PLATELET
Basophils Absolute: 0 10*3/uL (ref 0.0–0.2)
Basos: 0 %
EOS (ABSOLUTE): 0.1 10*3/uL (ref 0.0–0.4)
Eos: 1 %
Hematocrit: 38.9 % (ref 34.0–46.6)
Hemoglobin: 12.4 g/dL (ref 11.1–15.9)
Immature Grans (Abs): 0 10*3/uL (ref 0.0–0.1)
Immature Granulocytes: 0 %
Lymphocytes Absolute: 4.1 10*3/uL — ABNORMAL HIGH (ref 0.7–3.1)
Lymphs: 45 %
MCH: 24.8 pg — ABNORMAL LOW (ref 26.6–33.0)
MCHC: 31.9 g/dL (ref 31.5–35.7)
MCV: 78 fL — ABNORMAL LOW (ref 79–97)
Monocytes Absolute: 0.7 10*3/uL (ref 0.1–0.9)
Monocytes: 8 %
Neutrophils Absolute: 4.1 10*3/uL (ref 1.4–7.0)
Neutrophils: 46 %
Platelets: 439 10*3/uL (ref 150–450)
RBC: 5 x10E6/uL (ref 3.77–5.28)
RDW: 14 % (ref 11.7–15.4)
WBC: 9 10*3/uL (ref 3.4–10.8)

## 2021-02-04 LAB — ANA W/REFLEX IF POSITIVE
Anti JO-1: <0.2 AI (ref 0.0–0.9)
Anti Nuclear Antibody (ANA): POSITIVE — AB
Centromere Ab Screen: <0.2 AI (ref 0.0–0.9)
Chromatin Ab SerPl-aCnc: <0.2 AI (ref 0.0–0.9)
ENA RNP Ab: 2.3 AI — ABNORMAL HIGH (ref 0.0–0.9)
ENA SM Ab Ser-aCnc: <0.2 AI (ref 0.0–0.9)
ENA SSA (RO) Ab: 0.4 AI (ref 0.0–0.9)
ENA SSB (LA) Ab: <0.2 AI (ref 0.0–0.9)
Scleroderma (Scl-70) (ENA) Antibody, IgG: <0.2 AI (ref 0.0–0.9)
dsDNA Ab: <1 IU/mL (ref 0–9)

## 2021-02-04 LAB — COMPREHENSIVE METABOLIC PANEL
ALT: 22 IU/L (ref 0–32)
AST: 16 IU/L (ref 0–40)
Albumin/Globulin Ratio: 1.1 — ABNORMAL LOW (ref 1.2–2.2)
Albumin: 3.9 g/dL (ref 3.9–5.0)
Alkaline Phosphatase: 114 IU/L (ref 44–121)
BUN/Creatinine Ratio: 18 (ref 9–23)
BUN: 14 mg/dL (ref 6–20)
Bilirubin Total: <0.2 mg/dL (ref 0.0–1.2)
CO2: 21 mmol/L (ref 20–29)
Calcium: 9.2 mg/dL (ref 8.7–10.2)
Chloride: 103 mmol/L (ref 96–106)
Creatinine, Ser: 0.78 mg/dL (ref 0.57–1.00)
Globulin, Total: 3.4 g/dL (ref 1.5–4.5)
Glucose: 118 mg/dL — ABNORMAL HIGH (ref 70–99)
Potassium: 4 mmol/L (ref 3.5–5.2)
Sodium: 139 mmol/L (ref 134–144)
Total Protein: 7.3 g/dL (ref 6.0–8.5)
eGFR: 105 mL/min/{1.73_m2} (ref >59)

## 2021-02-04 LAB — TSH+FREE T4
Free T4: 1.22 ng/dL (ref 0.82–1.77)
TSH: 2.07 u[IU]/mL (ref 0.450–4.500)

## 2021-02-04 LAB — SEDIMENTATION RATE: Sed Rate: 106 mm/hr — ABNORMAL HIGH (ref 0–32)

## 2021-02-04 LAB — RHEUMATOID FACTOR: Rheumatoid fact SerPl-aCnc: <10 IU/mL (ref <14.0)

## 2021-02-04 NOTE — Telephone Encounter (Signed)
Patient is declining to schedule sleep study due to cost. tat

## 2021-02-24 ENCOUNTER — Ambulatory Visit: Payer: No Typology Code available for payment source | Admitting: Nurse Practitioner

## 2021-03-07 ENCOUNTER — Ambulatory Visit
Admission: RE | Admit: 2021-03-07 | Discharge: 2021-03-07 | Disposition: A | Discharge status: Home / Self Care | Payer: No Typology Code available for payment source | Attending: Physician Assistant | Admitting: Physician Assistant

## 2021-03-07 ENCOUNTER — Other Ambulatory Visit: Payer: Self-pay

## 2021-03-07 ENCOUNTER — Encounter: Payer: Self-pay | Admitting: Physician Assistant

## 2021-03-07 ENCOUNTER — Ambulatory Visit (INDEPENDENT_AMBULATORY_CARE_PROVIDER_SITE_OTHER): Payer: No Typology Code available for payment source | Admitting: Physician Assistant

## 2021-03-07 ENCOUNTER — Ambulatory Visit
Admission: RE | Admit: 2021-03-07 | Discharge: 2021-03-07 | Disposition: A | Discharge status: Home / Self Care | Payer: No Typology Code available for payment source | Source: Ambulatory Visit | Attending: Physician Assistant | Admitting: Physician Assistant

## 2021-03-07 VITALS — BP 140/83 | HR 98 | Temp 98.5°F | Resp 16 | Ht 67.0 in | Wt 335.8 lb

## 2021-03-07 DIAGNOSIS — R700 Elevated erythrocyte sedimentation rate: Secondary | ICD-10-CM

## 2021-03-07 DIAGNOSIS — E1169 Type 2 diabetes mellitus with other specified complication: Secondary | ICD-10-CM | POA: Diagnosis not present

## 2021-03-07 DIAGNOSIS — M25522 Pain in left elbow: Secondary | ICD-10-CM | POA: Diagnosis present

## 2021-03-07 DIAGNOSIS — R768 Other specified abnormal immunological findings in serum: Secondary | ICD-10-CM

## 2021-03-07 DIAGNOSIS — I10 Essential (primary) hypertension: Secondary | ICD-10-CM

## 2021-03-07 DIAGNOSIS — M130 Polyarthritis, unspecified: Secondary | ICD-10-CM

## 2021-03-07 DIAGNOSIS — Z6841 Body Mass Index (BMI) 40.0 and over, adult: Secondary | ICD-10-CM

## 2021-03-07 LAB — POCT GLYCOSYLATED HEMOGLOBIN (HGB A1C): Hemoglobin A1C: 6.3 % — AB (ref 4.0–5.6)

## 2021-03-07 MED ORDER — TIRZEPATIDE 2.5 MG/0.5ML ~~LOC~~ SOAJ: 2.5000 mg | SUBCUTANEOUS | 0 refills | Status: DC

## 2021-03-07 NOTE — Progress Notes (Signed)
[facility] [place], [place] [address]  Internal MEDICINE  Office Visit Note  Patient Name: Tara Estes  [id number]  [id number]  Date of Service: 03/07/2021  Chief Complaint  Patient presents with   Follow-up   Diabetes   Gastroesophageal Reflux   Quality Metric Gaps    Papsmear    HPI Pt is here for routine follow up -HST was too expensive -Back pain is more bothersome,  left arm/elbow hurts with full extension or flexion for the last month. Does not recall specific injury but does work [place] and could have done something. Already takes ibuprofen/tylenol. -Working a lot, 150 hours in 2 weeks -more nasal drainage recently will try nasal spray and antihistamine -reviewed labs--elevated sed rate and ANA positive with possible connective tissue disease-- will refer to rheumatology especially given chronic pain.  Low MCV with hx of IDA and will start iron supplement again. -Also had elevated sugar on labs. Reports a hx of prediabetes and was treated with metformin which she doesn't think she tolerated and went without any treatment for a long time. -interested in weight loss medication as well as pain limits her activity and she is unable to lose weight. -Does note that she urinates frequently and thinks this may be the BP med but could also be her diabetes and will try treating. BP is stable.  Current Medication: Outpatient Encounter Medications as of 03/07/2021  Medication Sig   acetaminophen (TYLENOL) 500 MG tablet Take 1,000 mg by mouth every 6 (six) hours as needed.   albuterol (VENTOLIN HFA) 108 (90 Base) MCG/ACT inhaler Inhale 2 puffs into the lungs every 4 (four) hours as needed for wheezing or shortness of breath.   amLODipine (NORVASC) 5 MG tablet Take 1 tablet (5 mg total) by mouth daily.   etonogestrel (NEXPLANON) 68 MG IMPL implant 1 each by Subdermal route once.   methocarbamol (ROBAXIN) 500 MG tablet Take 1 tablet (500 mg total) by mouth  every 6 (six) hours as needed.   tirzepatide [facility]) 2.5 MG/0.5ML Pen Inject 2.5 mg into the skin once a week.   No facility-administered encounter medications on file as of 03/07/2021.    Surgical History: Past Surgical History:  Procedure Laterality Date   CYSTOSCOPY W/ URETERAL STENT PLACEMENT Bilateral 04/02/2019   Procedure: CYSTOSCOPY WITH RETROGRADE PYELOGRAM/URETERAL STENT PLACEMENT;  Surgeon: Hollice Espy, MD;  Location: [facility];  Service: Urology;  Laterality: Bilateral;   CYSTOSCOPY W/ URETERAL STENT REMOVAL Bilateral 04/02/2019   Procedure: CYSTOSCOPY WITH STENT REMOVAL;  Surgeon: Ward, Honor Loh, MD;  Location: [facility];  Service: Gynecology;  Laterality: Bilateral;   head injury     requiring closure   LAPAROSCOPY N/A 03/07/2019   Procedure: LAPAROSCOPY DIAGNOSTIC, lysis of adhesions;  Surgeon: Ward, Honor Loh, MD;  Location: [facility];  Service: Gynecology;  Laterality: N/A;   ROBOTIC ASSISTED LAPAROSCOPIC OVARIAN CYSTECTOMY Right 04/02/2019   Procedure: XI ROBOTIC ASSISTED LAPAROSCOPIC OVARIAN CYSTECTOMY;  Surgeon: Ward, Honor Loh, MD;  Location: [facility];  Service: Gynecology;  Laterality: Right;   ROBOTIC ASSISTED TOTAL HYSTERECTOMY WITH BILATERAL SALPINGO OOPHERECTOMY Bilateral 04/02/2019   Procedure: XI ROBOTIC ASSISTED TOTAL HYSTERECTOMY WITH LEFT OOPHORECTOMY, BILATERAL SALPINGECTOMY, OVARIOLYSIS, ENTEROLYSIS,;  Surgeon: Ward, Honor Loh, MD;  Location: [facility];  Service: Gynecology;  Laterality: Bilateral;    Medical History: Past Medical History:  Diagnosis Date   Arthritis    Bipolar 1 disorder ([place])    younger, no meds now   Diabetes mellitus  Endometriosis    GERD (gastroesophageal reflux disease)    Headache     Family History: Family History  Problem Relation Age of Onset   Hypertension Mother    Hypertension Father    Heart disease Father     Social History   Socioeconomic History   Marital status: Single    Spouse name: Not on file    Number of children: Not on file   Years of education: Not on file   Highest education level: Not on file  Occupational History   Not on file  Tobacco Use   Smoking status: Some Days    Types: Cigarettes   Smokeless tobacco: Never   Tobacco comments:    4-5 cigarettes a day  Vaping Use   Vaping Use: Some days  Substance and Sexual Activity   Alcohol use: Yes    Comment: rarely   Drug use: No   Sexual activity: Never  Other Topics Concern   Not on file  Social History Narrative   Not on file   Social Determinants of Health   Financial Resource Strain: Not on file  Food Insecurity: Not on file  Transportation Needs: Not on file  Physical Activity: Not on file  Stress: Not on file  Social Connections: Not on file  Intimate Partner Violence: Not on file      Review of Systems  Constitutional:  Positive for fatigue. Negative for chills and unexpected weight change.  HENT:  Negative for congestion, postnasal drip, rhinorrhea, sneezing and sore throat.   Eyes:  Negative for redness.  Respiratory:  Positive for apnea. Negative for cough, chest tightness and shortness of breath.   Cardiovascular:  Negative for chest pain and palpitations.  Gastrointestinal:  Negative for abdominal pain, constipation, diarrhea, nausea and vomiting.  Genitourinary:  Negative for dysuria and frequency.  Musculoskeletal:  Positive for arthralgias. Negative for back pain, joint swelling and neck pain.  Skin:  Negative for rash.  Neurological:  Positive for headaches. Negative for tremors and numbness.  Hematological:  Negative for adenopathy. Does not bruise/bleed easily.  Psychiatric/Behavioral:  Positive for sleep disturbance. Negative for behavioral problems (Depression) and suicidal ideas. The patient is nervous/anxious.    Vital Signs: BP 140/83    Pulse 98    Temp 98.5 F (36.9 C)    Resp 16    Ht 5\' 7"  (1.702 m)    Wt (!) 335 lb 12.8 oz (152.3 kg)    LMP 02/27/2019    SpO2 99%    BMI 52.59  kg/m    Physical Exam Vitals and nursing note reviewed.  Constitutional:      General: She is not in acute distress.    Appearance: She is well-developed. She is obese. She is not diaphoretic.  HENT:     Head: Normocephalic and atraumatic.     Mouth/Throat:     Pharynx: No oropharyngeal exudate.  Eyes:     Pupils: Pupils are equal, round, and reactive to light.  Neck:     Thyroid: No thyromegaly.     Vascular: No JVD.     Trachea: No tracheal deviation.  Cardiovascular:     Rate and Rhythm: Normal rate and regular rhythm.     Heart sounds: Normal heart sounds. No murmur heard.   No friction rub. No gallop.  Pulmonary:     Effort: Pulmonary effort is normal. No respiratory distress.     Breath sounds: No wheezing or rales.  Chest:  Chest wall: No tenderness.  Abdominal:     General: Bowel sounds are normal.     Palpations: Abdomen is soft.  Musculoskeletal:        General: Tenderness present.     Cervical back: Normal range of motion and neck supple.     Comments: Left elbow tenderness with increased pain on ROM with both extension and flexion  Lymphadenopathy:     Cervical: No cervical adenopathy.  Skin:    General: Skin is warm and dry.  Neurological:     Mental Status: She is alert and oriented to person, place, and time.     Cranial Nerves: No cranial nerve deficit.  Psychiatric:        Behavior: Behavior normal.        Thought Content: Thought content normal.        Judgment: Judgment normal.       Assessment/Plan: 1. Type 2 diabetes mellitus with other specified complication, without long-term current use of insulin (HCC) - POCT HgB A1C is 6.3, will start on mounjaro for A1c control and wt loss benefits. Also work on diet and exercise. Previously did not do well on metformin. - tirzepatide [facility]) 2.5 MG/0.5ML Pen; Inject 2.5 mg into the skin once a week.  Dispense: 2 mL; Refill: 0  2. Essential (primary) hypertension Stable, continue current  medication  3. Left elbow pain Will order xray to evaluate further. May need ortho if not improving given she already takes ibuprofen/tylenol daily. May try heat/ice - DG Elbow Complete Left; Future  4. Positive ANA (antinuclear antibody) Will refer to rheumatology for further eval and treat - Ambulatory referral to Rheumatology  5. Elevated sed rate Will refer to rheumatology for further eval and treat - Ambulatory referral to Rheumatology  6. Polyarthropathy Will refer to rheumatology for further eval and treat - Ambulatory referral to Rheumatology  7. Morbid obesity with BMI of 50.0-59.9, adult ([place]) Will start mounjaro for sugar control and wt loss benefits and work on diet and exercise. Unable to afford HST at this time, but will notify office if this changes.   General Counseling: Chasiti verbalizes understanding of the findings of todays visit and agrees with plan of treatment. I have discussed any further diagnostic evaluation that may be needed or ordered today. We also reviewed her medications today. she has been encouraged to call the office with any questions or concerns that should arise related to todays visit.    Orders Placed This Encounter  Procedures   DG Elbow Complete Left   Ambulatory referral to Rheumatology   POCT HgB A1C    Meds ordered this encounter  Medications   tirzepatide (MOUNJARO) 2.5 MG/0.5ML Pen    Sig: Inject 2.5 mg into the skin once a week.    Dispense:  2 mL    Refill:  0    This patient was seen by Drema Dallas, PA-C in collaboration with Dr. Clayborn Bigness as a part of collaborative care agreement.   Total time spent:35 Minutes Time spent includes review of chart, medications, test results, and follow up plan with the patient.      Dr Lavera Guise Internal medicine

## 2021-03-08 ENCOUNTER — Telehealth: Payer: Self-pay

## 2021-03-08 ENCOUNTER — Other Ambulatory Visit: Payer: Self-pay

## 2021-03-08 MED ORDER — METHOCARBAMOL 500 MG PO TABS: 500.0000 mg | ORAL_TABLET | Freq: Every evening | ORAL | 1 refills | Status: DC

## 2021-03-08 NOTE — Telephone Encounter (Signed)
-----   Message from Mylinda Latina, PA-C sent at 03/07/2021  4:30 PM EST ----- Please let her know that her xray was normal. Can try heat/ice as discussed and could try an elbow brace OTC to see if this helps, but if still bothersome may need to see ortho

## 2021-03-08 NOTE — Telephone Encounter (Signed)
Pt notified for xray result

## 2021-03-08 NOTE — Telephone Encounter (Signed)
Pt called and asked about a muscle relaxer that Ander Purpura was suppose to send to pharmacy at visit on 03/07/21, per lauren we sent in Methocarbamol 500 mg nightly to pharmacy and pt was informed we sent rx

## 2021-03-13 ENCOUNTER — Telehealth: Payer: Self-pay

## 2021-03-13 DIAGNOSIS — E1169 Type 2 diabetes mellitus with other specified complication: Secondary | ICD-10-CM

## 2021-03-13 MED ORDER — TIRZEPATIDE 2.5 MG/0.5ML ~~LOC~~ SOAJ: 2.5000 mg | SUBCUTANEOUS | 3 refills | Status: DC

## 2021-03-13 NOTE — Telephone Encounter (Signed)
PA for [facility] 2.5 mg sent 03/13/21 @ 235pm  PA came back approved valis to 03/12/22

## 2021-03-22 ENCOUNTER — Telehealth: Payer: Self-pay

## 2021-03-22 ENCOUNTER — Other Ambulatory Visit: Payer: Self-pay | Admitting: Physician Assistant

## 2021-03-22 DIAGNOSIS — I10 Essential (primary) hypertension: Secondary | ICD-10-CM

## 2021-03-22 MED ORDER — TRIAMTERENE-HCTZ 37.5-25 MG PO CAPS
1.0000 | ORAL_CAPSULE | Freq: Every day | ORAL | 3 refills | Status: DC
Start: 2021-03-22 — End: 2023-05-21

## 2021-03-22 NOTE — Telephone Encounter (Signed)
Pt notified that we send med  

## 2021-03-22 NOTE — Telephone Encounter (Signed)
Lmom to call us back 

## 2021-04-21 ENCOUNTER — Ambulatory Visit (INDEPENDENT_AMBULATORY_CARE_PROVIDER_SITE_OTHER): Payer: No Typology Code available for payment source | Admitting: Physician Assistant

## 2021-04-21 ENCOUNTER — Encounter: Payer: Self-pay | Admitting: Physician Assistant

## 2021-04-21 VITALS — BP 128/80 | HR 88 | Temp 98.1°F | Resp 16 | Wt 335.0 lb

## 2021-04-21 DIAGNOSIS — J0100 Acute maxillary sinusitis, unspecified: Secondary | ICD-10-CM | POA: Diagnosis not present

## 2021-04-21 DIAGNOSIS — E1169 Type 2 diabetes mellitus with other specified complication: Secondary | ICD-10-CM | POA: Diagnosis not present

## 2021-04-21 MED ORDER — AZITHROMYCIN 250 MG PO TABS: ORAL_TABLET | ORAL | 0 refills | Status: AC

## 2021-04-21 MED ORDER — TIRZEPATIDE 5 MG/0.5ML ~~LOC~~ SOAJ
5.0000 mg | SUBCUTANEOUS | 0 refills | Status: DC
Start: 2021-04-21 — End: 2021-05-12

## 2021-04-21 NOTE — Progress Notes (Signed)
?[place] ?[address] ?[place], [place] [address] ? ?Internal MEDICINE  ?Office Visit Note ? ?Patient Name: Tara Estes ? [id number]  ?[id number] ? ?Date of Service: 04/27/2021 ? ?Chief Complaint  ?Patient presents with  ? Telephone Assessment  ? Telephone Screen  ? Sinusitis  ? Cough  ? ? ? ?HPI ?Pt is here for a sick visit. ?-Started 1 week ago with sore throat and did gargle with warm salt water for 2 days. Then started congestion and body aches. Taking mucinex, dayquil/nyquil. Tried nasal spray. ?-some SOB and wheezing ?-[place]ed from hone[place] with her wife and they both have the same progressions of symptoms  ?-Covid negative ?-Pt also needs mounjaro refill and is tolerating well. Will increase dose to '5mg'$  weekly. ? ?Current Medication: ? ?Outpatient Encounter Medications as of 04/21/2021  ?Medication Sig  ? acetaminophen (TYLENOL) 500 MG tablet Take 1,000 mg by mouth every 6 (six) hours as needed.  ? albuterol (VENTOLIN HFA) 108 (90 Base) MCG/ACT inhaler Inhale 2 puffs into the lungs every 4 (four) hours as needed for wheezing or shortness of breath.  ? amLODipine (NORVASC) 5 MG tablet Take 1 tablet (5 mg total) by mouth daily.  ? [EXPIRED] azithromycin (ZITHROMAX) 250 MG tablet Take 2 tablets on day 1, then 1 tablet daily on days 2 through 5  ? etonogestrel (NEXPLANON) 68 MG IMPL implant 1 each by Subdermal route once.  ? methocarbamol (ROBAXIN) 500 MG tablet Take 1 tablet (500 mg total) by mouth at bedtime.  ? tirzepatide (MOUNJARO) 5 MG/0.5ML Pen Inject 5 mg into the skin once a week.  ? triamterene-hydrochlorothiazide (DYAZIDE) 37.5-25 MG capsule Take 1 each (1 capsule total) by mouth daily.  ? [DISCONTINUED] tirzepatide (MOUNJARO) 2.5 MG/0.5ML Pen Inject 2.5 mg into the skin once a week.  ? ?No facility-administered encounter medications on file as of 04/21/2021.  ? ? ? ? ?Medical History: ?Past Medical History:  ?Diagnosis Date  ? Arthritis   ? Bipolar 1 disorder ([place])   ? younger, no  meds now  ? Diabetes mellitus   ? Endometriosis   ? GERD (gastroesophageal reflux disease)   ? Headache   ? ? ? ?Vital Signs: ?BP 128/80   Pulse 88   Temp 98.1 ?F (36.7 ?C)   Resp 16   Wt (!) 335 lb (152 kg)   LMP 02/27/2019   SpO2 98%   BMI 52.47 kg/m?  ? ? ?Review of Systems  ?Constitutional:  Positive for fatigue. Negative for fever.  ?HENT:  Positive for congestion and postnasal drip. Negative for mouth sores.   ?Respiratory:  Positive for cough, shortness of breath and wheezing.   ?Cardiovascular:  Negative for chest pain.  ?Genitourinary:  Negative for flank pain.  ?Musculoskeletal:  Positive for myalgias.  ?Neurological:  Positive for headaches.  ?Psychiatric/Behavioral: Negative.    ? ?Physical Exam ?Vitals and nursing note reviewed.  ?Constitutional:   ?   General: She is not in acute distress. ?   Appearance: She is well-developed. She is obese. She is not diaphoretic.  ?HENT:  ?   Head: Normocephalic and atraumatic.  ?   Mouth/Throat:  ?   Pharynx: No oropharyngeal exudate.  ?Eyes:  ?   Pupils: Pupils are equal, round, and reactive to light.  ?Neck:  ?   Thyroid: No thyromegaly.  ?   Vascular: No JVD.  ?   Trachea: No tracheal deviation.  ?Cardiovascular:  ?   Rate and Rhythm: Normal rate and regular rhythm.  ?  Heart sounds: Normal heart sounds. No murmur heard. ?  No friction rub. No gallop.  ?Pulmonary:  ?   Effort: Pulmonary effort is normal. No respiratory distress.  ?   Breath sounds: No wheezing or rales.  ?Chest:  ?   Chest wall: No tenderness.  ?Abdominal:  ?   General: Bowel sounds are normal.  ?   Palpations: Abdomen is soft.  ?Musculoskeletal:     ?   General: Normal range of motion.  ?   Cervical back: Normal range of motion and neck supple.  ?Lymphadenopathy:  ?   Cervical: No cervical adenopathy.  ?Skin: ?   General: Skin is warm and dry.  ?Neurological:  ?   Mental Status: She is alert and oriented to person, place, and time.  ?   Cranial Nerves: No cranial nerve deficit.   ?Psychiatric:     ?   Behavior: Behavior normal.     ?   Thought Content: Thought content normal.     ?   Judgment: Judgment normal.  ? ? ? ? ?Assessment/Plan: ?1. Acute non-recurrent maxillary sinusitis ?Will start zpak and continue mucinex and nasal spray. Advised to rest and stay well hydrated ?- azithromycin (ZITHROMAX) 250 MG tablet; Take 2 tablets on day 1, then 1 tablet daily on days 2 through 5  Dispense: 6 tablet; Refill: 0 ? ?2. Type 2 diabetes mellitus with other specified complication, without long-term current use of insulin ([place]) ?Will increase mounjaro to '5mg'$  weekly ?- tirzepatide [facility]) 5 MG/0.5ML Pen; Inject 5 mg into the skin once a week.  Dispense: 2 mL; Refill: 0 ? ? ?General Counseling: Bassy verbalizes understanding of the findings of todays visit and agrees with plan of treatment. I have discussed any further diagnostic evaluation that may be needed or ordered today. We also reviewed her medications today. she has been encouraged to call the office with any questions or concerns that should arise related to todays visit. ? ? ? ?Counseling: ? ? ? ?No orders of the defined types were placed in this encounter. ? ? ?Meds ordered this encounter  ?Medications  ? azithromycin (ZITHROMAX) 250 MG tablet  ?  Sig: Take 2 tablets on day 1, then 1 tablet daily on days 2 through 5  ?  Dispense:  6 tablet  ?  Refill:  0  ? tirzepatide (MOUNJARO) 5 MG/0.5ML Pen  ?  Sig: Inject 5 mg into the skin once a week.  ?  Dispense:  2 mL  ?  Refill:  0  ? ? ?Time spent:30 Minutes ?

## 2021-05-09 NOTE — Progress Notes (Signed)
Office Visit Note  Patient: Tara Estes             Date of Birth: [birth year]           MRN: [id number]             PCP: Mylinda Latina, PA-C Referring: Mylinda Latina, PA* Visit Date: 05/10/2021 Occupation: Security at [employer]:  New Patient (Initial Visit) (Abnormal labs)   History of Present Illness: Tara Estes is a 31 y.o. female here for evaluation of multiple joint pains with positive ANA and elevated sed rate. She has significant back pain and recently was seen for left elbow pain with xray checked that was normal. Labs checked in January with positive ANA and RNP antibodies, sed rate highly elevated 106. She has joint pains ongoing since about 2018 she started noticing pain in her back and in bilateral hands. This has progressively worsened and started having more trouble in her knees. She gets leg swelling more so after work shifts. Hand swelling also that comes and goes throughout the day. Back pain is more associated with numerous tight muscle knots and popping, she gets very stiff with fixed positions and also wakes her at night interfering with sleep. She takes tylenol and ibuprofen as needed these help but only about an hour at a time. She was prescribed robaxin which helps sleep at night but not a long lasting benefit. She denies alopecia, oral ulcers, lymphadenopathy, raynaud's, or history of blood clots.  Her wife is present at our visit today helping with collateral history and remembering the plan.  Activities of Daily Living:  Patient reports morning stiffness for 24 hours.   Patient Reports nocturnal pain.  Difficulty dressing/grooming: Reports Difficulty climbing stairs: Reports Difficulty getting out of chair: Reports Difficulty using hands for taps, buttons, cutlery, and/or writing: Reports  Review of Systems  Constitutional:  Positive for fatigue.  HENT:  Positive for mouth dryness.   Eyes:  Negative for dryness.   Respiratory:  Positive for shortness of breath.   Cardiovascular:  Positive for swelling in legs/feet.  Gastrointestinal:  Positive for constipation.  Endocrine: Positive for cold intolerance, heat intolerance and increased urination.  Genitourinary:  Negative for difficulty urinating.  Musculoskeletal:  Positive for joint pain, gait problem, joint pain, joint swelling, muscle weakness, morning stiffness and muscle tenderness.  Skin:  Negative for rash.  Allergic/Immunologic: Negative for susceptible to infections.  Neurological:  Positive for numbness and weakness.  Hematological:  Negative for bruising/bleeding tendency.  Psychiatric/Behavioral:  Positive for sleep disturbance.    PMFS History:  Patient Active Problem List   Diagnosis Date Noted   Positive ANA (antinuclear antibody) 05/10/2021   Elevated sed rate 05/10/2021   Polyarthritis 05/10/2021   Post-operative state 04/02/2019   Endometriosis    Pelvic adhesive disease    S/P laparoscopic surgery 03/07/2019    Past Medical History:  Diagnosis Date   Arthritis    Bipolar 1 disorder ([place])    younger, no meds now   Diabetes mellitus    Endometriosis    GERD (gastroesophageal reflux disease)    Headache     Family History  Problem Relation Age of Onset   Hypertension Mother    Hypertension Father    Heart disease Father    Bipolar disorder Father    Past Surgical History:  Procedure Laterality Date   CYSTOSCOPY W/ URETERAL STENT PLACEMENT Bilateral 04/02/2019   Procedure: CYSTOSCOPY WITH RETROGRADE PYELOGRAM/URETERAL STENT PLACEMENT;  Surgeon:  Hollice Espy, MD;  Location: [facility];  Service: Urology;  Laterality: Bilateral;   CYSTOSCOPY W/ URETERAL STENT REMOVAL Bilateral 04/02/2019   Procedure: CYSTOSCOPY WITH STENT REMOVAL;  Surgeon: Ward, Honor Loh, MD;  Location: [facility];  Service: Gynecology;  Laterality: Bilateral;   head injury     requiring closure   LAPAROSCOPY N/A 03/07/2019   Procedure: LAPAROSCOPY  DIAGNOSTIC, lysis of adhesions;  Surgeon: Ward, Honor Loh, MD;  Location: [facility];  Service: Gynecology;  Laterality: N/A;   ROBOTIC ASSISTED LAPAROSCOPIC OVARIAN CYSTECTOMY Right 04/02/2019   Procedure: XI ROBOTIC ASSISTED LAPAROSCOPIC OVARIAN CYSTECTOMY;  Surgeon: Ward, Honor Loh, MD;  Location: [facility];  Service: Gynecology;  Laterality: Right;   ROBOTIC ASSISTED TOTAL HYSTERECTOMY WITH BILATERAL SALPINGO OOPHERECTOMY Bilateral 04/02/2019   Procedure: XI ROBOTIC ASSISTED TOTAL HYSTERECTOMY WITH LEFT OOPHORECTOMY, BILATERAL SALPINGECTOMY, OVARIOLYSIS, ENTEROLYSIS,;  Surgeon: Ward, Honor Loh, MD;  Location: [facility];  Service: Gynecology;  Laterality: Bilateral;   Social History   Social History Narrative   Not on file   Immunization History  Administered Date(s) Administered   Influenza-Unspecified 10/27/2020   PFIZER(Purple Top)SARS-COV-2 Vaccination 06/06/2019, 06/26/2019     Objective: Vital Signs: BP (!) 144/89 (BP Location: Right Arm, Patient Position: Sitting, Cuff Size: Large)   Pulse 94   Resp 16   Ht '5\' 7"'$  (1.702 m)   Wt (!) 337 lb (152.9 kg)   LMP 02/27/2019   BMI 52.78 kg/m    Physical Exam Constitutional:      Appearance: She is obese.  HENT:     Mouth/Throat:     Mouth: Mucous membranes are moist.     Pharynx: Oropharynx is clear.  Eyes:     Conjunctiva/sclera: Conjunctivae normal.  Cardiovascular:     Rate and Rhythm: Normal rate and regular rhythm.  Pulmonary:     Effort: Pulmonary effort is normal.     Breath sounds: Normal breath sounds.  Lymphadenopathy:     Cervical: No cervical adenopathy.  Skin:    General: Skin is warm and dry.     Findings: No rash.  Neurological:     General: No focal deficit present.     Mental Status: She is alert.     Deep Tendon Reflexes: Reflexes normal.  Psychiatric:        Mood and Affect: Mood normal.     Musculoskeletal Exam:  Shoulders full ROM, tenderness to pressure pain with overhead abduction and reaching  behind back, no palpable swelling Left elbow pain on lateral and medial sides, radiation up arm with percussion over cubital tunnel, right elbow normal Wrists full ROM no tenderness or swelling Fingers full ROM, MCP joint tenderness worst is right 3rd MCP no palpable synovitis Paraspinal muscle tenderness up and down back, some radiation of pain with pressure, has some palpable muscle knots Knees full ROM no swelling mild pain with full extension Ankles full ROM no tenderness or swelling   Investigation: No additional findings.  Imaging: No results found.  Recent Labs: Lab Results  Component Value Date   WBC 9.0 02/03/2021   HGB 12.4 02/03/2021   PLT 439 02/03/2021   NA 139 02/03/2021   K 4.0 02/03/2021   CL 103 02/03/2021   CO2 21 02/03/2021   GLUCOSE 118 (H) 02/03/2021   BUN 14 02/03/2021   CREATININE 0.78 02/03/2021   BILITOT <0.2 02/03/2021   ALKPHOS 114 02/03/2021   AST 16 02/03/2021   ALT 22 02/03/2021   PROT 7.3 02/03/2021   ALBUMIN 3.9  02/03/2021   CALCIUM 9.2 02/03/2021   GFRAA >60 03/15/2019    Speciality Comments: No specialty comments available.  Procedures:  No procedures performed Allergies: Other   Assessment / Plan:     Visit Diagnoses: Positive ANA (antinuclear antibody)  Elevated sed rate  Polyarthritis - Plan: Sedimentation rate, C-reactive protein, Cyclic citrul peptide antibody, IgG, hydroxychloroquine (PLAQUENIL) 200 MG tablet  Joint pain in multiple areas has some typical features including the prolonged stiffness and associated swelling with symptom exacerbation.  Not much synovitis present on physical exam today.  Could also be related to use with her work and body habitus contributory.  Checking sed rate and CRP for inflammatory disease activity assessment.  We will also check CCP antibody titer question if this could be seropositive RA with incidental positive ANA.  Recommend trial of starting hydroxychloroquine 400 mg daily for seronegative  inflammatory arthritis or possible early connective tissue disease inflammation.  Eye exam due to high risk medication, encounter for - Plan: Ambulatory referral to Ophthalmology  Planning to start hydroxychloroquine treatment today discussed need for ophthalmology exam for baseline screening retinal toxicity monitoring.  Orders: Orders Placed This Encounter  Procedures   Sedimentation rate   C-reactive protein   Cyclic citrul peptide antibody, IgG   Ambulatory referral to Ophthalmology   Meds ordered this encounter  Medications   hydroxychloroquine (PLAQUENIL) 200 MG tablet    Sig: Take 2 tablets (400 mg total) by mouth daily.    Dispense:  60 tablet    Refill:  1     Follow-Up Instructions: Return in about 2 months (around 07/10/2021) for New pt ?RA/CTD HCQ start f/u 68mo.   CCollier Salina MD  Note - This record has been created using [employer]  Chart creation errors have been sought, but may not always  have been located. Such creation errors do not reflect on  the standard of medical care.

## 2021-05-10 ENCOUNTER — Ambulatory Visit (INDEPENDENT_AMBULATORY_CARE_PROVIDER_SITE_OTHER): Payer: No Typology Code available for payment source | Admitting: Internal Medicine

## 2021-05-10 ENCOUNTER — Encounter: Payer: Self-pay | Admitting: Internal Medicine

## 2021-05-10 VITALS — BP 144/89 | HR 94 | Resp 16 | Ht 67.0 in | Wt 337.0 lb

## 2021-05-10 DIAGNOSIS — R700 Elevated erythrocyte sedimentation rate: Secondary | ICD-10-CM | POA: Insufficient documentation

## 2021-05-10 DIAGNOSIS — R768 Other specified abnormal immunological findings in serum: Secondary | ICD-10-CM | POA: Diagnosis not present

## 2021-05-10 DIAGNOSIS — M130 Polyarthritis, unspecified: Secondary | ICD-10-CM | POA: Diagnosis not present

## 2021-05-10 DIAGNOSIS — Z79899 Other long term (current) drug therapy: Secondary | ICD-10-CM

## 2021-05-10 MED ORDER — HYDROXYCHLOROQUINE SULFATE 200 MG PO TABS: 400.0000 mg | ORAL_TABLET | Freq: Every day | ORAL | 1 refills | Status: DC

## 2021-05-10 NOTE — Patient Instructions (Signed)
Hydroxychloroquine Tablets ?What is this medication? ?HYDROXYCHLOROQUINE (hye drox ee KLOR [place]) treats autoimmune conditions, such as rheumatoid arthritis and lupus. It works by slowing down an overactive immune system. It may also be used to prevent and treat malaria. It works by killing the parasite that causes malaria. It belongs to a group of medications called DMARDs. ?This medicine may be used for other purposes; ask your health care provider or pharmacist if you have questions. ?COMMON BRAND NAME(S): Plaquenil, Quineprox ?What should I tell my care team before I take this medication? ?They need to know if you have any of these conditions: ?Diabetes ?Eye disease, vision problems ?G6PD deficiency ?Heart disease ?History of irregular heartbeat ?If you often drink alcohol ?Kidney disease ?Liver disease ?Porphyria ?Psoriasis ?An unusual or allergic reaction to chloroquine, hydroxychloroquine, other medications, foods, dyes, or preservatives ?Pregnant or trying to get pregnant ?Breast-feeding ?How should I use this medication? ?Take this medication by mouth with a glass of water. Take it as directed on the prescription label. Do not cut, crush or chew this medication. Swallow the tablets whole. Take it with food. Do not take it more than directed. Take all of this medication unless your care team tells you to stop it early. Keep taking it even if you think you are better. ?Take products with antacids in them at a different time of day than this medication. Take this medication 4 hours before or 4 hours after antacids. Talk to your care team if you have questions. ?Talk to your care team about the use of this medication in children. While this medication may be prescribed for selected conditions, precautions do apply. ?Overdosage: If you think you have taken too much of this medicine contact a poison control center or emergency room at once. ?NOTE: This medicine is only for you. Do not share this medicine with  others. ?What if I miss a dose? ?If you miss a dose, take it as soon as you can. If it is almost time for your next dose, take only that dose. Do not take double or extra doses. ?What may interact with this medication? ?Do not take this medication with any of the following: ?Cisapride ?Dronedarone ?Pimozide ?Thioridazine ?This medication may also interact with the following: ?Ampicillin ?Antacids ?Cimetidine ?Cyclosporine ?Digoxin ?Kaolin ?Medications for diabetes, like insulin, glipizide, glyburide ?Medications for seizures like carbamazepine, phenobarbital, phenytoin ?Mefloquine ?Methotrexate ?Other medications that prolong the QT interval (cause an abnormal heart rhythm) ?Praziquantel ?This list may not describe all possible interactions. Give your health care provider a list of all the medicines, herbs, non-prescription drugs, or dietary supplements you use. Also tell them if you smoke, drink alcohol, or use illegal drugs. Some items may interact with your medicine. ?What should I watch for while using this medication? ?Visit your care team for regular checks on your progress. Tell your care team if your symptoms do not start to get better or if they get worse. ?You may need blood work done while you are taking this medication. If you take other medications that can affect heart rhythm, you may need more testing. Talk to your care team if you have questions. ?Your vision may be tested before and during use of this medication. Tell your care team right away if you have any change in your eyesight. ?This medication may cause serious skin reactions. They can happen weeks to months after starting the medication. Contact your care team right away if you notice fevers or flu-like symptoms with a rash. The   rash may be red or purple and then turn into blisters or peeling of the skin. Or, you might notice a red rash with swelling of the face, lips or lymph nodes in your neck or under your arms. ?If you or your family  notice any changes in your behavior, such as new or worsening depression, thoughts of harming yourself, anxiety, or other unusual or disturbing thoughts, or memory loss, call your care team right away. ?What side effects may I notice from receiving this medication? ?Side effects that you should report to your care team as soon as possible: ?Allergic reactions--skin rash, itching, hives, swelling of the face, lips, tongue, or throat ?Aplastic anemia--unusual weakness or fatigue, dizziness, headache, trouble breathing, increased bleeding or bruising ?Change in vision ?Heart rhythm changes--fast or irregular heartbeat, dizziness, feeling faint or lightheaded, chest pain, trouble breathing ?Infection--fever, chills, cough, or sore throat ?Low blood sugar (hypoglycemia)--tremors or shaking, anxiety, sweating, cold or clammy skin, confusion, dizziness, rapid heartbeat ?Muscle injury--unusual weakness or fatigue, muscle pain, dark yellow or brown urine, decrease in amount of urine ?Pain, tingling, or numbness in the hands or feet ?Rash, fever, and swollen lymph nodes ?Redness, blistering, peeling, or loosening of the skin, including inside the mouth ?Thoughts of suicide or self-harm, worsening mood, or feelings of depression ?Unusual bruising or bleeding ?Side effects that usually do not require medical attention (report to your care team if they continue or are bothersome): ?Diarrhea ?Headache ?Nausea ?Stomach pain ?Vomiting ?This list may not describe all possible side effects. Call your doctor for medical advice about side effects. You may report side effects to FDA at 1-800-FDA-1088. ?Where should I keep my medication? ?Keep out of the reach of children and pets. ?Store at room temperature up to 30 degrees C (86 degrees F). Protect from light. Get rid of any unused medication after the expiration date. ?To get rid of medications that are no longer needed or have expired: ?Take the medication to a medication take-back  program. Check with your pharmacy or law enforcement to find a location. ?If you cannot return the medication, check the label or package insert to see if the medication should be thrown out in the garbage or flushed down the toilet. If you are not sure, ask your care team. If it is safe to put it in the trash, empty the medication out of the container. Mix the medication with cat litter, dirt, coffee grounds, or other unwanted substance. Seal the mixture in a bag or container. Put it in the trash. ?NOTE: This sheet is a summary. It may not cover all possible information. If you have questions about this medicine, talk to your doctor, pharmacist, or health care provider. ?? 2023 Elsevier/Gold Standard (2020-05-20 00:00:00) ? ?

## 2021-05-11 LAB — CYCLIC CITRUL PEPTIDE ANTIBODY, IGG: Cyclic Citrullin Peptide Ab: <16 UNITS

## 2021-05-11 LAB — C-REACTIVE PROTEIN: CRP: 13.8 mg/L — ABNORMAL HIGH (ref <8.0)

## 2021-05-11 LAB — SEDIMENTATION RATE: Sed Rate: 39 mm/h — ABNORMAL HIGH (ref 0–20)

## 2021-05-12 ENCOUNTER — Ambulatory Visit (INDEPENDENT_AMBULATORY_CARE_PROVIDER_SITE_OTHER): Payer: No Typology Code available for payment source | Admitting: Physician Assistant

## 2021-05-12 ENCOUNTER — Encounter: Payer: Self-pay | Admitting: Physician Assistant

## 2021-05-12 VITALS — BP 131/89 | HR 94 | Temp 97.6°F | Resp 16 | Ht 67.0 in | Wt 338.0 lb

## 2021-05-12 DIAGNOSIS — K219 Gastro-esophageal reflux disease without esophagitis: Secondary | ICD-10-CM

## 2021-05-12 DIAGNOSIS — I10 Essential (primary) hypertension: Secondary | ICD-10-CM | POA: Diagnosis not present

## 2021-05-12 DIAGNOSIS — E1169 Type 2 diabetes mellitus with other specified complication: Secondary | ICD-10-CM

## 2021-05-12 DIAGNOSIS — M130 Polyarthritis, unspecified: Secondary | ICD-10-CM

## 2021-05-12 DIAGNOSIS — Z6841 Body Mass Index (BMI) 40.0 and over, adult: Secondary | ICD-10-CM

## 2021-05-12 MED ORDER — TIRZEPATIDE 7.5 MG/0.5ML ~~LOC~~ SOAJ: 7.5000 mg | SUBCUTANEOUS | 0 refills | Status: DC

## 2021-05-12 MED ORDER — AMLODIPINE BESYLATE 5 MG PO TABS: 5.0000 mg | ORAL_TABLET | Freq: Every day | ORAL | 1 refills | Status: DC

## 2021-05-12 MED ORDER — OMEPRAZOLE 40 MG PO CPDR: 40.0000 mg | DELAYED_RELEASE_CAPSULE | Freq: Every day | ORAL | 3 refills | Status: DC

## 2021-05-12 NOTE — Progress Notes (Signed)
[facility] ?[address] ?[place], [place] [address] ? ?Internal MEDICINE  ?Office Visit Note ? ?Patient Name: Tara Estes ? [id number]  ?[id number] ? ?Date of Service: 05/12/2021 ? ?Chief Complaint  ?Patient presents with  ? Follow-up  ? Gastroesophageal Reflux  ? Diabetes  ? Medication Management  ?  Amlodipine now will be sent to [facility] and not [facility]  ? ? ?HPI ?Pt is here for routine follow up ?-is dropping down to part time 6-8hours a day, ~2.5 days per week due to extreme pain ?-In a lot of pain, especially in her knees today as well as her back still. ?-Saw rheumatology on Tuesday and will be starting plaquenil today and has follow up with rheumatology in 2 months. They are also checking additional labs to determine cause ?-she has also been experiencing acid reflux and would like to start medication at this point ?-Tolerating mounjaro well but unfortunately has not lost weight, this may be due to going on her honeymoon and being in so much pain that she has been unable to exercise or move very much. Due for a1c next visit ? ?Current Medication: ?Outpatient Encounter Medications as of 05/12/2021  ?Medication Sig  ? acetaminophen (TYLENOL) 500 MG tablet Take 1,000 mg by mouth every 6 (six) hours as needed.  ? albuterol (VENTOLIN HFA) 108 (90 Base) MCG/ACT inhaler Inhale 2 puffs into the lungs every 4 (four) hours as needed for wheezing or shortness of breath.  ? etonogestrel (NEXPLANON) 68 MG IMPL implant 1 each by Subdermal route once.  ? hydroxychloroquine (PLAQUENIL) 200 MG tablet Take 2 tablets (400 mg total) by mouth daily.  ? methocarbamol (ROBAXIN) 500 MG tablet Take 1 tablet (500 mg total) by mouth at bedtime.  ? omeprazole (PRILOSEC) 40 MG capsule Take 1 capsule (40 mg total) by mouth daily.  ? tirzepatide (MOUNJARO) 7.5 MG/0.5ML Pen Inject 7.5 mg into the skin once a week.  ? triamterene-hydrochlorothiazide (DYAZIDE) 37.5-25 MG capsule Take 1 each (1 capsule total) by mouth  daily.  ? [DISCONTINUED] amLODipine (NORVASC) 5 MG tablet Take 1 tablet (5 mg total) by mouth daily.  ? [DISCONTINUED] tirzepatide (MOUNJARO) 5 MG/0.5ML Pen Inject 5 mg into the skin once a week.  ? amLODipine (NORVASC) 5 MG tablet Take 1 tablet (5 mg total) by mouth daily.  ? ?No facility-administered encounter medications on file as of 05/12/2021.  ? ? ?Surgical History: ?Past Surgical History:  ?Procedure Laterality Date  ? CYSTOSCOPY W/ URETERAL STENT PLACEMENT Bilateral 04/02/2019  ? Procedure: CYSTOSCOPY WITH RETROGRADE PYELOGRAM/URETERAL STENT PLACEMENT;  Surgeon: Hollice Espy, MD;  Location: [facility];  Service: Urology;  Laterality: Bilateral;  ? CYSTOSCOPY W/ URETERAL STENT REMOVAL Bilateral 04/02/2019  ? Procedure: CYSTOSCOPY WITH STENT REMOVAL;  Surgeon: Ward, Honor Loh, MD;  Location: [facility];  Service: Gynecology;  Laterality: Bilateral;  ? head injury    ? requiring closure  ? LAPAROSCOPY N/A 03/07/2019  ? Procedure: LAPAROSCOPY DIAGNOSTIC, lysis of adhesions;  Surgeon: Ward, Honor Loh, MD;  Location: [facility];  Service: Gynecology;  Laterality: N/A;  ? ROBOTIC ASSISTED LAPAROSCOPIC OVARIAN CYSTECTOMY Right 04/02/2019  ? Procedure: XI ROBOTIC ASSISTED LAPAROSCOPIC OVARIAN CYSTECTOMY;  Surgeon: Ward, Honor Loh, MD;  Location: [facility];  Service: Gynecology;  Laterality: Right;  ? ROBOTIC ASSISTED TOTAL HYSTERECTOMY WITH BILATERAL SALPINGO OOPHERECTOMY Bilateral 04/02/2019  ? Procedure: XI ROBOTIC ASSISTED TOTAL HYSTERECTOMY WITH LEFT OOPHORECTOMY, BILATERAL SALPINGECTOMY, OVARIOLYSIS, ENTEROLYSIS,;  Surgeon: Ward, Honor Loh, MD;  Location: [facility];  Service: Gynecology;  Laterality: Bilateral;  ? ? ?Medical History: ?Past Medical History:  ?Diagnosis Date  ? Arthritis   ? Bipolar 1 disorder ([place])   ? younger, no meds now  ? Diabetes mellitus   ? Endometriosis   ? GERD (gastroesophageal reflux disease)   ? Headache   ? ? ?Family History: ?Family History  ?Problem Relation Age of Onset  ? Hypertension  Mother   ? Hypertension Father   ? Heart disease Father   ? Bipolar disorder Father   ? ? ?Social History  ? ?Socioeconomic History  ? Marital status: Married  ?  Spouse name: Not on file  ? Number of children: Not on file  ? Years of education: Not on file  ? Highest education level: Not on file  ?Occupational History  ? Not on file  ?Tobacco Use  ? Smoking status: Some Days  ?  Packs/day: 0.10  ?  Years: 12.00  ?  Pack years: 1.20  ?  Types: Cigarettes  ? Smokeless tobacco: Never  ? Tobacco comments:  ?  4-5 cigarettes a day  ?Vaping Use  ? Vaping Use: Some days  ?Substance and Sexual Activity  ? Alcohol use: Yes  ?  Comment: rarely  ? Drug use: No  ? Sexual activity: Never  ?Other Topics Concern  ? Not on file  ?Social History Narrative  ? Not on file  ? ?Social Determinants of Health  ? ?Financial Resource Strain: Not on file  ?Food Insecurity: Not on file  ?Transportation Needs: Not on file  ?Physical Activity: Not on file  ?Stress: Not on file  ?Social Connections: Not on file  ?Intimate Partner Violence: Not on file  ? ? ? ? ?Review of Systems  ?Constitutional:  Positive for fatigue. Negative for chills and unexpected weight change.  ?HENT:  Negative for congestion, postnasal drip, rhinorrhea, sneezing and sore throat.   ?Eyes:  Negative for redness.  ?Respiratory:  Negative for cough, chest tightness and shortness of breath.   ?Cardiovascular:  Negative for chest pain and palpitations.  ?Gastrointestinal:  Negative for abdominal pain, constipation, diarrhea, nausea and vomiting.  ?     Reflux  ?Genitourinary:  Negative for dysuria and frequency.  ?Musculoskeletal:  Positive for arthralgias and back pain. Negative for joint swelling and neck pain.  ?Skin:  Negative for rash.  ?Neurological: Negative.  Negative for tremors and numbness.  ?Hematological:  Negative for adenopathy. Does not bruise/bleed easily.  ?Psychiatric/Behavioral:  Positive for sleep disturbance. Negative for behavioral problems  (Depression) and suicidal ideas. The patient is nervous/anxious.   ? ?Vital Signs: ?BP 131/89   Pulse 94   Temp 97.6 ?F (36.4 ?C)   Resp 16   Ht '5\' 7"'$  (1.702 m)   Wt (!) 338 lb (153.3 kg)   LMP 02/27/2019   SpO2 99%   BMI 52.94 kg/m?  ? ? ?Physical Exam ?Vitals and nursing note reviewed.  ?Constitutional:   ?   General: She is not in acute distress. ?   Appearance: She is well-developed. She is obese. She is not diaphoretic.  ?HENT:  ?   Head: Normocephalic and atraumatic.  ?   Mouth/Throat:  ?   Pharynx: No oropharyngeal exudate.  ?Eyes:  ?   Pupils: Pupils are equal, round, and reactive to light.  ?Neck:  ?   Thyroid: No thyromegaly.  ?   Vascular: No JVD.  ?   Trachea: No tracheal deviation.  ?Cardiovascular:  ?   Rate and Rhythm: Normal rate  and regular rhythm.  ?   Heart sounds: Normal heart sounds. No murmur heard. ?  No friction rub. No gallop.  ?Pulmonary:  ?   Effort: Pulmonary effort is normal. No respiratory distress.  ?   Breath sounds: No wheezing or rales.  ?Chest:  ?   Chest wall: No tenderness.  ?Abdominal:  ?   General: Bowel sounds are normal.  ?   Palpations: Abdomen is soft.  ?Musculoskeletal:     ?   General: Normal range of motion.  ?   Cervical back: Normal range of motion and neck supple.  ?Lymphadenopathy:  ?   Cervical: No cervical adenopathy.  ?Skin: ?   General: Skin is warm and dry.  ?Neurological:  ?   Mental Status: She is alert and oriented to person, place, and time.  ?   Cranial Nerves: No cranial nerve deficit.  ?Psychiatric:     ?   Behavior: Behavior normal.     ?   Thought Content: Thought content normal.     ?   Judgment: Judgment normal.  ? ? ? ? ? ?Assessment/Plan: ?1. Essential (primary) hypertension ?Stable, continue current medications ?- amLODipine (NORVASC) 5 MG tablet; Take 1 tablet (5 mg total) by mouth daily.  Dispense: 90 tablet; Refill: 1 ? ?2. Type 2 diabetes mellitus with other specified complication, without long-term current use of insulin  ([place]) ?Tolerating well and is going to be administering her last week of the 5 mg dosage of Mounjaro and would like to step up to the next dose.  We will send prescription for 7.5 mg and patient will have follow-up in 4 weeks to recheck A1c

## 2021-05-15 ENCOUNTER — Other Ambulatory Visit: Payer: Self-pay | Admitting: Physician Assistant

## 2021-05-15 DIAGNOSIS — E1169 Type 2 diabetes mellitus with other specified complication: Secondary | ICD-10-CM

## 2021-05-18 NOTE — Progress Notes (Signed)
The CCP test the other RA antibody marker was also negative. Her inflammatory markers are still high but much less than a few months ago. So I am not certain of RA but this does not rule it out, and I still recommend trying the HCQ for joint inflammation and seeing if we get a benefit.

## 2021-06-05 ENCOUNTER — Other Ambulatory Visit: Payer: Self-pay | Admitting: Physician Assistant

## 2021-06-05 DIAGNOSIS — E1169 Type 2 diabetes mellitus with other specified complication: Secondary | ICD-10-CM

## 2021-06-06 IMAGING — CR DG CHEST 2V
2 series · 2 of 2 positions shown · non-contrast
Comparison: 03/23/2018

CLINICAL DATA: Left-sided chest pain. [id number] exposure.
Hypertension and diabetes. Smoker.

EXAM:
CHEST - 2 VIEW

[chest pa]
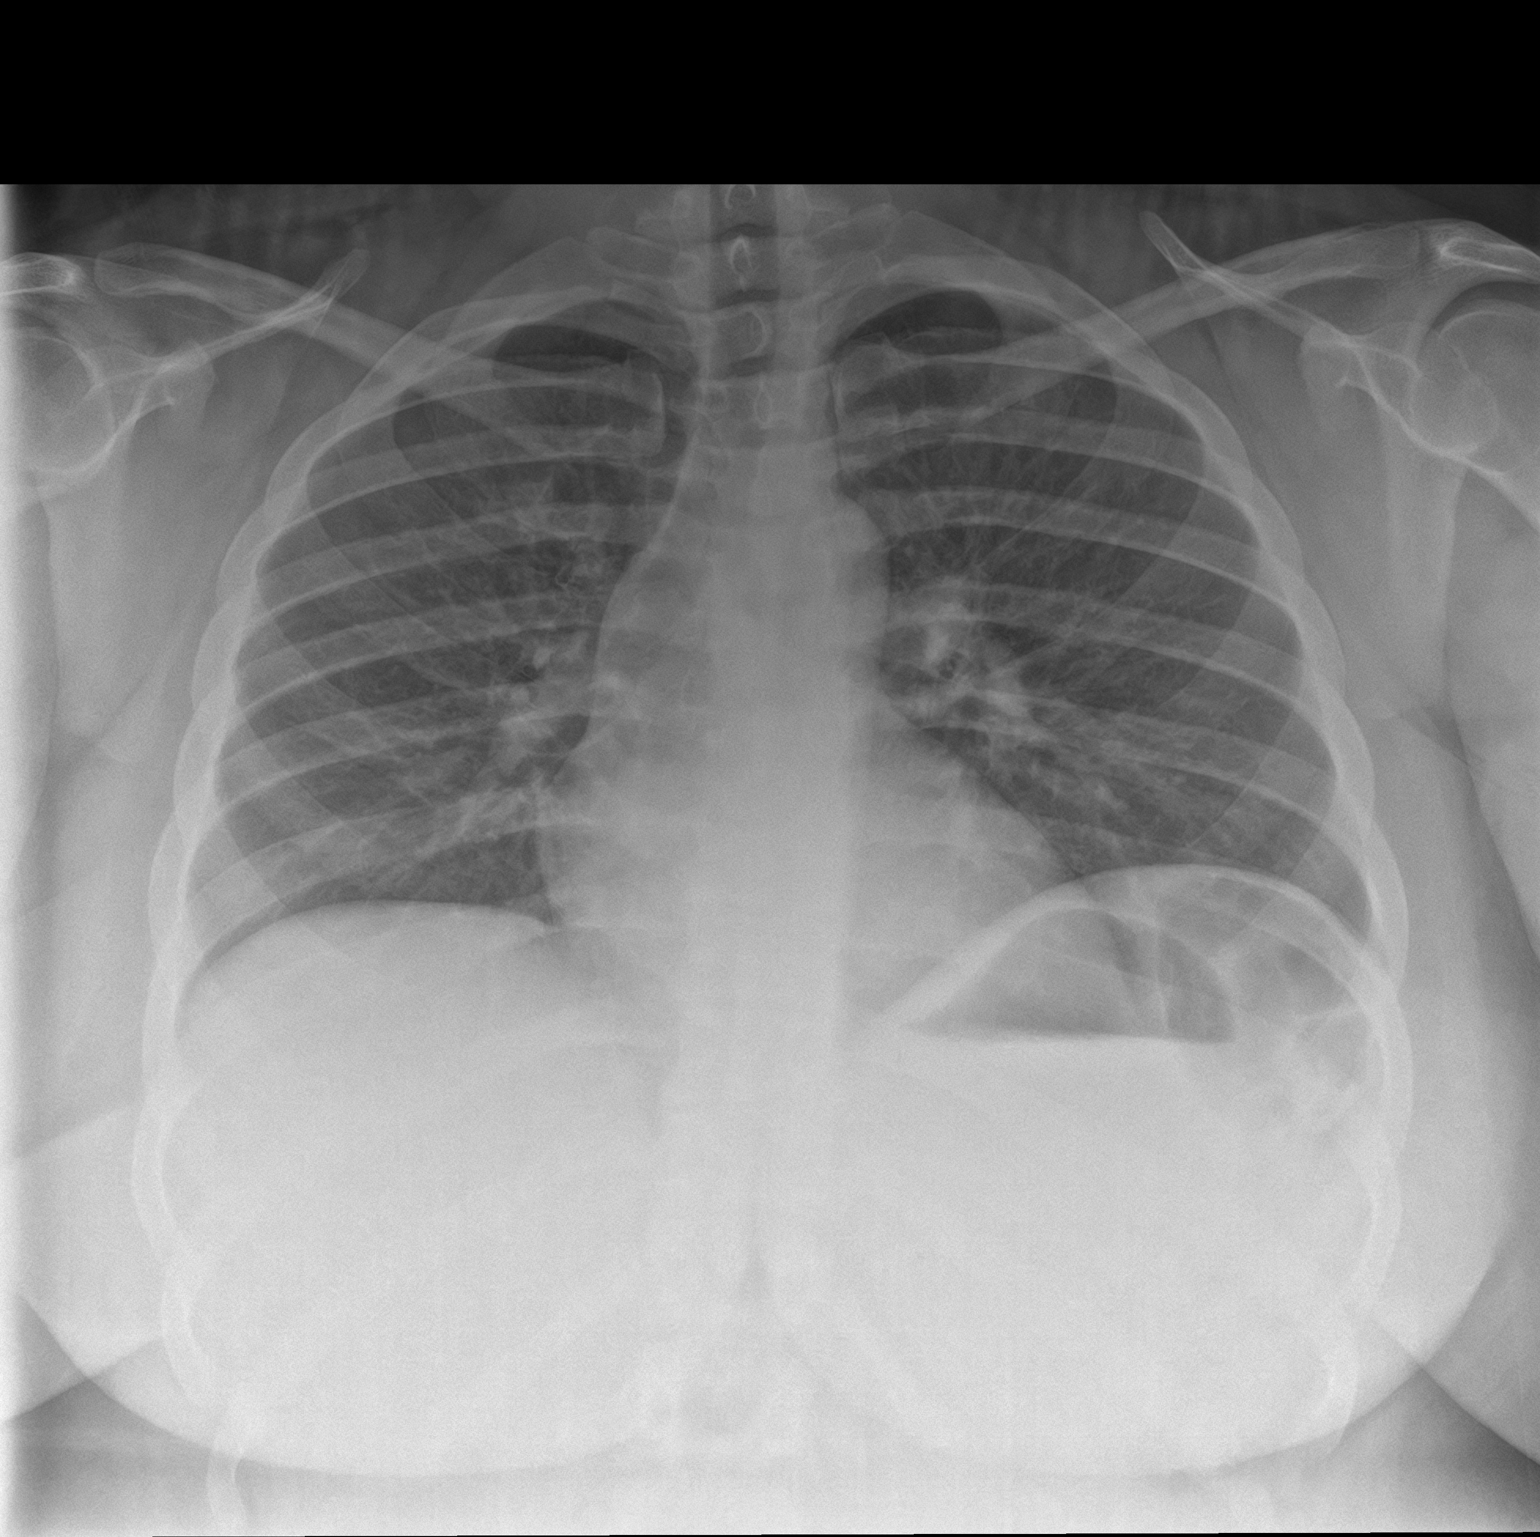

[chest lat]
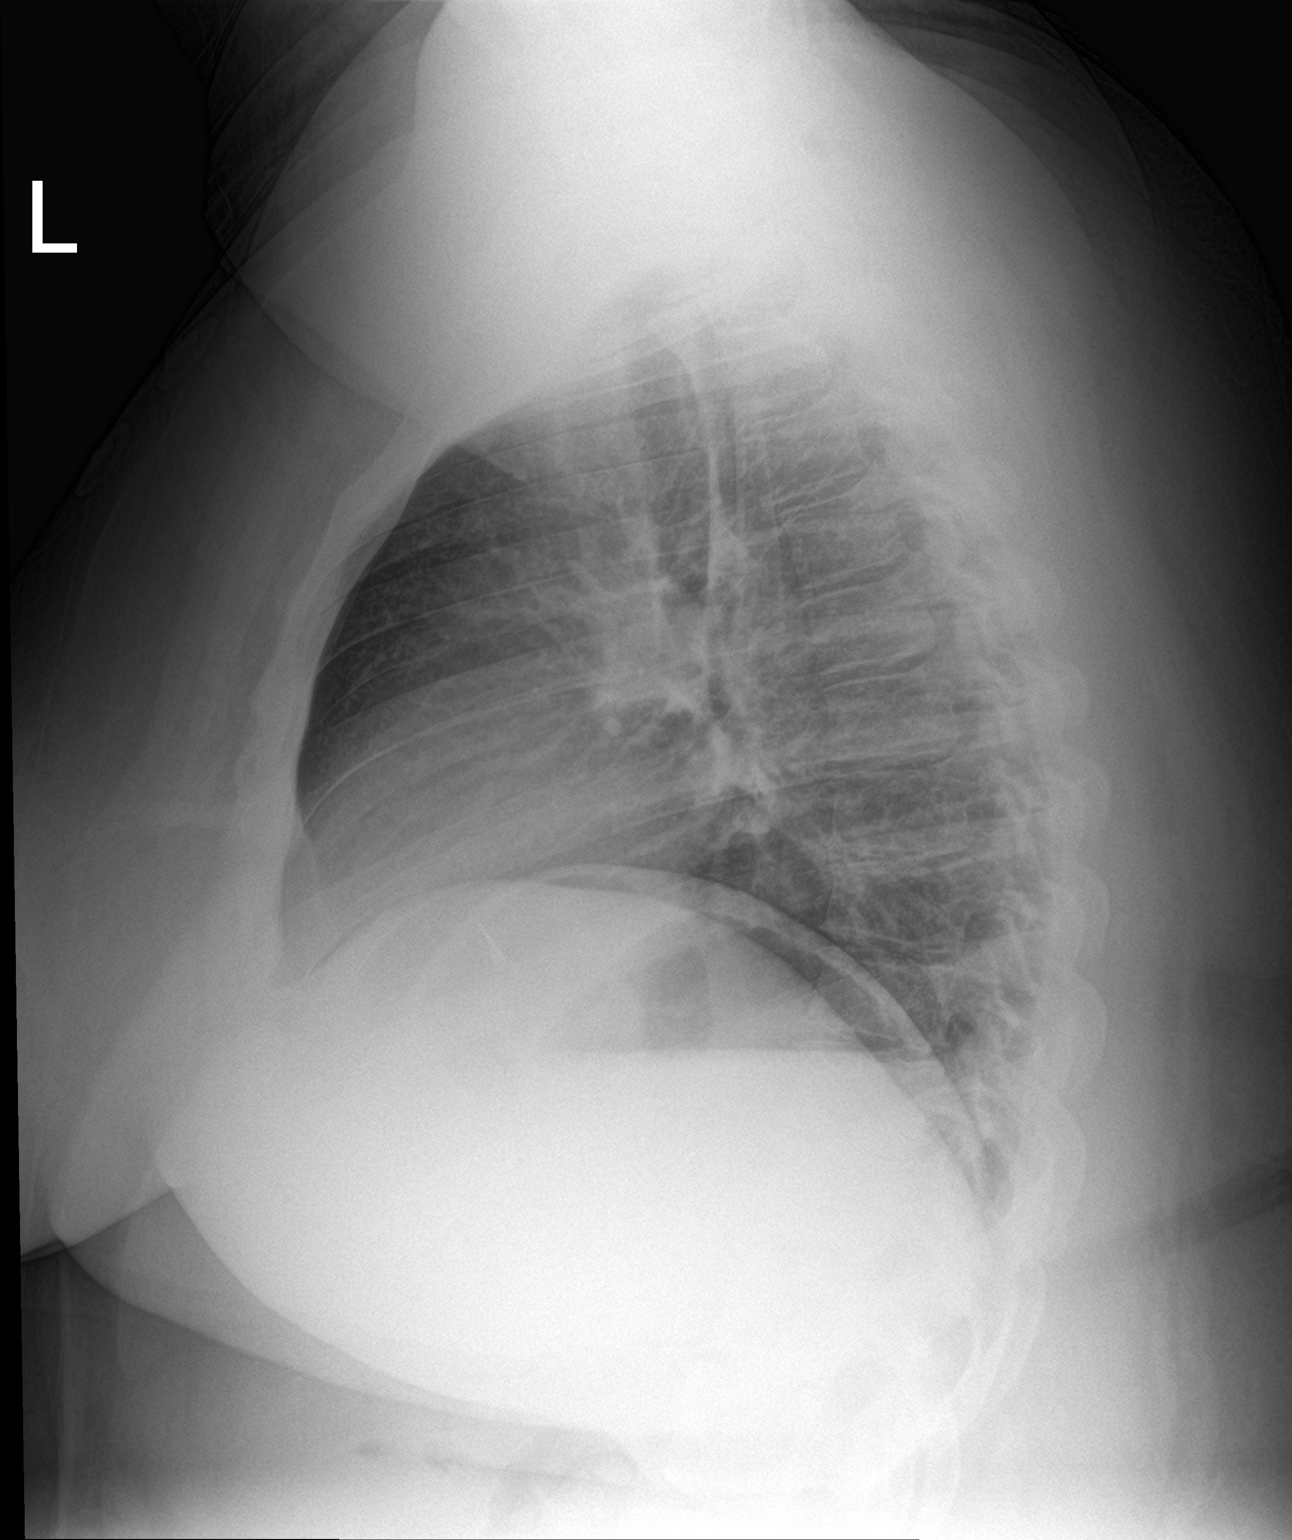

[2 of 2 positions shown; findings below may reference images not displayed]

FINDINGS: Midline trachea.  Normal heart size and mediastinal contours.

Sharp costophrenic angles.  No pneumothorax.  Clear lungs.
IMPRESSION: No active cardiopulmonary disease.

## 2021-06-10 ENCOUNTER — Ambulatory Visit: Payer: Commercial Managed Care - HMO | Admitting: Physician Assistant

## 2021-07-04 ENCOUNTER — Ambulatory Visit: Payer: 59 | Admitting: Internal Medicine

## 2021-07-26 ENCOUNTER — Telehealth: Payer: Self-pay

## 2021-07-27 NOTE — Telephone Encounter (Signed)
error 

## 2021-08-29 ENCOUNTER — Other Ambulatory Visit: Payer: Self-pay | Admitting: Physician Assistant

## 2021-10-21 ENCOUNTER — Telehealth (INDEPENDENT_AMBULATORY_CARE_PROVIDER_SITE_OTHER): Payer: Commercial Managed Care - HMO | Admitting: Nurse Practitioner

## 2021-10-21 ENCOUNTER — Encounter: Payer: Self-pay | Admitting: Nurse Practitioner

## 2021-10-21 VITALS — Resp 16 | Ht 67.0 in

## 2021-10-21 DIAGNOSIS — J0100 Acute maxillary sinusitis, unspecified: Secondary | ICD-10-CM | POA: Diagnosis not present

## 2021-10-21 DIAGNOSIS — R051 Acute cough: Secondary | ICD-10-CM | POA: Diagnosis not present

## 2021-10-21 DIAGNOSIS — R062 Wheezing: Secondary | ICD-10-CM | POA: Diagnosis not present

## 2021-10-21 MED ORDER — AMOXICILLIN-POT CLAVULANATE 875-125 MG PO TABS
1.0000 | ORAL_TABLET | Freq: Two times a day (BID) | ORAL | 0 refills | Status: AC
Start: 2021-10-21 — End: 2021-10-31

## 2021-10-21 MED ORDER — HYDROCOD POLI-CHLORPHE POLI ER 10-8 MG/5ML PO SUER: 5.0000 mL | Freq: Two times a day (BID) | ORAL | 0 refills | Status: DC | PRN

## 2021-10-21 MED ORDER — ALBUTEROL SULFATE HFA 108 (90 BASE) MCG/ACT IN AERS: 2.0000 | INHALATION_SPRAY | RESPIRATORY_TRACT | 1 refills | Status: DC | PRN

## 2021-10-21 NOTE — Progress Notes (Signed)
[facility] [place], [place] [address]  Internal MEDICINE  Telephone Visit  Patient Name: Tara Estes  [id number]  [id number]  Date of Service: 10/21/2021  I connected with the patient at 1230 by telephone and verified the patients identity using two identifiers.   I discussed the limitations, risks, security and privacy concerns of performing an evaluation and management service by telephone and the availability of in person appointments. I also discussed with the patient that there may be a patient responsible charge related to the service.  The patient expressed understanding and agrees to proceed.    Chief Complaint  Patient presents with   Telephone Assessment    [phone]   Telephone Screen    Neg covid, congested.     Headache   Sore Throat   Cough    HPI Tara Estes presents for a telehealth virtual visit for symptoms of sinus infection.  Reports nasal congestion headache cough and sore throat Onset was 1 week ago   Current Medication: Outpatient Encounter Medications as of 10/21/2021  Medication Sig   acetaminophen (TYLENOL) 500 MG tablet Take 1,000 mg by mouth every 6 (six) hours as needed.   amLODipine (NORVASC) 5 MG tablet Take 1 tablet (5 mg total) by mouth daily.   amoxicillin-clavulanate (AUGMENTIN) 875-125 MG tablet Take 1 tablet by mouth 2 (two) times daily for 10 days. Take with food   chlorpheniramine-HYDROcodone (TUSSIONEX) 10-8 MG/5ML Take 5 mLs by mouth every 12 (twelve) hours as needed for cough.   etonogestrel (NEXPLANON) 68 MG IMPL implant 1 each by Subdermal route once.   hydroxychloroquine (PLAQUENIL) 200 MG tablet Take 2 tablets (400 mg total) by mouth daily.   methocarbamol (ROBAXIN) 500 MG tablet TAKE ONE TABLET BY MOUTH AT BEDTIME   MOUNJARO 7.5 MG/0.5ML Pen INJECT 7.5 MG UNDER THE SKIN ONCE WEEKLY   omeprazole (PRILOSEC) 40 MG capsule Take 1 capsule (40 mg total) by mouth daily.   triamterene-hydrochlorothiazide  (DYAZIDE) 37.5-25 MG capsule Take 1 each (1 capsule total) by mouth daily.   [DISCONTINUED] albuterol (VENTOLIN HFA) 108 (90 Base) MCG/ACT inhaler Inhale 2 puffs into the lungs every 4 (four) hours as needed for wheezing or shortness of breath.   albuterol (VENTOLIN HFA) 108 (90 Base) MCG/ACT inhaler Inhale 2 puffs into the lungs every 4 (four) hours as needed for wheezing or shortness of breath.   No facility-administered encounter medications on file as of 10/21/2021.    Surgical History: Past Surgical History:  Procedure Laterality Date   CYSTOSCOPY W/ URETERAL STENT PLACEMENT Bilateral 04/02/2019   Procedure: CYSTOSCOPY WITH RETROGRADE PYELOGRAM/URETERAL STENT PLACEMENT;  Surgeon: Hollice Espy, MD;  Location: [facility];  Service: Urology;  Laterality: Bilateral;   CYSTOSCOPY W/ URETERAL STENT REMOVAL Bilateral 04/02/2019   Procedure: CYSTOSCOPY WITH STENT REMOVAL;  Surgeon: Ward, Honor Loh, MD;  Location: [facility];  Service: Gynecology;  Laterality: Bilateral;   head injury     requiring closure   LAPAROSCOPY N/A 03/07/2019   Procedure: LAPAROSCOPY DIAGNOSTIC, lysis of adhesions;  Surgeon: Ward, Honor Loh, MD;  Location: [facility];  Service: Gynecology;  Laterality: N/A;   ROBOTIC ASSISTED LAPAROSCOPIC OVARIAN CYSTECTOMY Right 04/02/2019   Procedure: XI ROBOTIC ASSISTED LAPAROSCOPIC OVARIAN CYSTECTOMY;  Surgeon: Ward, Honor Loh, MD;  Location: [facility];  Service: Gynecology;  Laterality: Right;   ROBOTIC ASSISTED TOTAL HYSTERECTOMY WITH BILATERAL SALPINGO OOPHERECTOMY Bilateral 04/02/2019   Procedure: XI ROBOTIC ASSISTED TOTAL HYSTERECTOMY WITH LEFT OOPHORECTOMY, BILATERAL SALPINGECTOMY, OVARIOLYSIS, ENTEROLYSIS,;  Surgeon: Ward, Honor Loh,  MD;  Location: [facility];  Service: Gynecology;  Laterality: Bilateral;    Medical History: Past Medical History:  Diagnosis Date   Arthritis    Bipolar 1 disorder ([place])    younger, no meds now   Diabetes mellitus    Endometriosis    GERD  (gastroesophageal reflux disease)    Headache     Family History: Family History  Problem Relation Age of Onset   Hypertension Mother    Hypertension Father    Heart disease Father    Bipolar disorder Father     Social History   Socioeconomic History   Marital status: Married    Spouse name: Not on file   Number of children: Not on file   Years of education: Not on file   Highest education level: Not on file  Occupational History   Not on file  Tobacco Use   Smoking status: Some Days    Packs/day: 0.10    Years: 12.00    Total pack years: 1.20    Types: Cigarettes   Smokeless tobacco: Never   Tobacco comments:    4-5 cigarettes a day  Vaping Use   Vaping Use: Some days  Substance and Sexual Activity   Alcohol use: Yes    Comment: rarely   Drug use: No   Sexual activity: Never  Other Topics Concern   Not on file  Social History Narrative   Not on file   Social Determinants of Health   Financial Resource Strain: Not on file  Food Insecurity: Not on file  Transportation Needs: Not on file  Physical Activity: Not on file  Stress: Not on file  Social Connections: Not on file  Intimate Partner Violence: Not on file      Review of Systems  Constitutional:  Positive for fatigue. Negative for fever.  HENT:  Positive for congestion, postnasal drip, sinus pressure, sinus pain and sore throat. Negative for ear pain.   Respiratory:  Positive for cough, chest tightness, shortness of breath and wheezing.   Cardiovascular: Negative.  Negative for chest pain and palpitations.  Gastrointestinal: Negative.  Negative for diarrhea, nausea and vomiting.  Musculoskeletal:  Positive for myalgias.  Neurological:  Positive for headaches.    Vital Signs: Resp 16   Ht '5\' 7"'$  (1.702 m)   LMP 02/27/2019   BMI 52.94 kg/m    Observation/Objective: She is alert and oriented and engages in conversation appropriately. She does not appear to be in any acute distress over video  call.     Assessment/Plan: 1. Acute non-recurrent maxillary sinusitis Empiric antibiotic treatment prescribed - amoxicillin-clavulanate (AUGMENTIN) 875-125 MG tablet; Take 1 tablet by mouth 2 (two) times daily for 10 days. Take with food  Dispense: 20 tablet; Refill: 0  2. Acute cough Medication prescribed for symptomatic relief of cough - chlorpheniramine-HYDROcodone (TUSSIONEX) 10-8 MG/5ML; Take 5 mLs by mouth every 12 (twelve) hours as needed for cough.  Dispense: 140 mL; Refill: 0  3. Wheezing Albuterol inhaler for wheezing. Did not give prednisone taper because she is diabetic and not able to get her medication because she just got back on insurance recently.  - albuterol (VENTOLIN HFA) 108 (90 Base) MCG/ACT inhaler; Inhale 2 puffs into the lungs every 4 (four) hours as needed for wheezing or shortness of breath.  Dispense: 8 g; Refill: 1   General Counseling: Tara Estes verbalizes understanding of the findings of today's phone visit and agrees with plan of treatment. I have discussed any further diagnostic evaluation  that may be needed or ordered today. We also reviewed her medications today. she has been encouraged to call the office with any questions or concerns that should arise related to todays visit.  Return if symptoms worsen or fail to improve.   No orders of the defined types were placed in this encounter.   Meds ordered this encounter  Medications   chlorpheniramine-HYDROcodone (TUSSIONEX) 10-8 MG/5ML    Sig: Take 5 mLs by mouth every 12 (twelve) hours as needed for cough.    Dispense:  140 mL    Refill:  0   amoxicillin-clavulanate (AUGMENTIN) 875-125 MG tablet    Sig: Take 1 tablet by mouth 2 (two) times daily for 10 days. Take with food    Dispense:  20 tablet    Refill:  0   albuterol (VENTOLIN HFA) 108 (90 Base) MCG/ACT inhaler    Sig: Inhale 2 puffs into the lungs every 4 (four) hours as needed for wheezing or shortness of breath.    Dispense:  8 g     Refill:  1    Time spent:10 Minutes Time spent with patient included reviewing progress notes, labs, imaging studies, and discussing plan for follow up.  [place] Controlled Substance Database was reviewed by me for overdose risk score (ORS) if appropriate.  This patient was seen by Jonetta Osgood, FNP-C in collaboration with Dr. Clayborn Bigness as a part of collaborative care agreement.  Tara Kettlewell R. Valetta Fuller, MSN, FNP-C Internal medicine

## 2021-11-17 ENCOUNTER — Telehealth (INDEPENDENT_AMBULATORY_CARE_PROVIDER_SITE_OTHER): Payer: Commercial Managed Care - HMO | Admitting: Physician Assistant

## 2021-11-17 ENCOUNTER — Encounter: Payer: Self-pay | Admitting: Physician Assistant

## 2021-11-17 VITALS — Resp 16 | Ht 67.0 in

## 2021-11-17 DIAGNOSIS — J069 Acute upper respiratory infection, unspecified: Secondary | ICD-10-CM

## 2021-11-17 DIAGNOSIS — R051 Acute cough: Secondary | ICD-10-CM

## 2021-11-17 DIAGNOSIS — R062 Wheezing: Secondary | ICD-10-CM

## 2021-11-17 MED ORDER — PREDNISONE 10 MG PO TABS: ORAL_TABLET | ORAL | 0 refills | Status: DC

## 2021-11-17 MED ORDER — DOXYCYCLINE HYCLATE 100 MG PO TABS: 100.0000 mg | ORAL_TABLET | Freq: Two times a day (BID) | ORAL | 0 refills | Status: DC

## 2021-11-17 MED ORDER — HYDROCOD POLI-CHLORPHE POLI ER 10-8 MG/5ML PO SUER: 5.0000 mL | Freq: Two times a day (BID) | ORAL | 0 refills | Status: DC | PRN

## 2021-11-17 NOTE — Progress Notes (Signed)
[facility] [place], [place] [address]  Internal MEDICINE  Telephone Visit  Patient Name: Tara Estes  [id number]  [id number]  Date of Service: 11/17/2021  I connected with the patient at 10:31 by telephone and verified the patients identity using two identifiers.   I discussed the limitations, risks, security and privacy concerns of performing an evaluation and management service by telephone and the availability of in person appointments. I also discussed with the patient that there may be a patient responsible charge related to the service.  The patient expressed understanding and agrees to proceed.    Chief Complaint  Patient presents with   Telephone Screen    [phone], prefers telephone call   Telephone Assessment    Negative for covid   Cough   Migraine   Nasal Congestion    Mucus is yellow/green    HPI Pt is here for virtual sick visit -She was treated for a sinus infection almost a month ago with augmentin and tussionex -Did a get a little better after antibiotic, but then cough started getting worse again and has remained -Mainly cough and SOB. Less migraines now. Very congested. Some wheezing. Using albuterol some and helps some. -negative for covid -no fevers, but some chills.   Current Medication: Outpatient Encounter Medications as of 11/17/2021  Medication Sig   acetaminophen (TYLENOL) 500 MG tablet Take 1,000 mg by mouth every 6 (six) hours as needed.   albuterol (VENTOLIN HFA) 108 (90 Base) MCG/ACT inhaler Inhale 2 puffs into the lungs every 4 (four) hours as needed for wheezing or shortness of breath.   amLODipine (NORVASC) 5 MG tablet Take 1 tablet (5 mg total) by mouth daily.   doxycycline (VIBRA-TABS) 100 MG tablet Take 1 tablet (100 mg total) by mouth 2 (two) times daily.   etonogestrel (NEXPLANON) 68 MG IMPL implant 1 each by Subdermal route once.   hydroxychloroquine (PLAQUENIL) 200 MG tablet Take 2 tablets (400 mg  total) by mouth daily.   methocarbamol (ROBAXIN) 500 MG tablet TAKE ONE TABLET BY MOUTH AT BEDTIME   MOUNJARO 7.5 MG/0.5ML Pen INJECT 7.5 MG UNDER THE SKIN ONCE WEEKLY   omeprazole (PRILOSEC) 40 MG capsule Take 1 capsule (40 mg total) by mouth daily.   predniSONE (DELTASONE) 10 MG tablet Take one tab 3 x day for 3 days, then take one tab 2 x a day for 3 days and then take one tab a day for 3 days for copd   triamterene-hydrochlorothiazide (DYAZIDE) 37.5-25 MG capsule Take 1 each (1 capsule total) by mouth daily.   [DISCONTINUED] chlorpheniramine-HYDROcodone (TUSSIONEX) 10-8 MG/5ML Take 5 mLs by mouth every 12 (twelve) hours as needed for cough.   chlorpheniramine-HYDROcodone (TUSSIONEX) 10-8 MG/5ML Take 5 mLs by mouth every 12 (twelve) hours as needed for cough.   No facility-administered encounter medications on file as of 11/17/2021.    Surgical History: Past Surgical History:  Procedure Laterality Date   CYSTOSCOPY W/ URETERAL STENT PLACEMENT Bilateral 04/02/2019   Procedure: CYSTOSCOPY WITH RETROGRADE PYELOGRAM/URETERAL STENT PLACEMENT;  Surgeon: Hollice Espy, MD;  Location: [facility];  Service: Urology;  Laterality: Bilateral;   CYSTOSCOPY W/ URETERAL STENT REMOVAL Bilateral 04/02/2019   Procedure: CYSTOSCOPY WITH STENT REMOVAL;  Surgeon: Ward, Honor Loh, MD;  Location: [facility];  Service: Gynecology;  Laterality: Bilateral;   head injury     requiring closure   LAPAROSCOPY N/A 03/07/2019   Procedure: LAPAROSCOPY DIAGNOSTIC, lysis of adhesions;  Surgeon: Ward, Honor Loh, MD;  Location: [facility]  ORS;  Service: Gynecology;  Laterality: N/A;   ROBOTIC ASSISTED LAPAROSCOPIC OVARIAN CYSTECTOMY Right 04/02/2019   Procedure: XI ROBOTIC ASSISTED LAPAROSCOPIC OVARIAN CYSTECTOMY;  Surgeon: Ward, Honor Loh, MD;  Location: [facility];  Service: Gynecology;  Laterality: Right;   ROBOTIC ASSISTED TOTAL HYSTERECTOMY WITH BILATERAL SALPINGO OOPHERECTOMY Bilateral 04/02/2019   Procedure: XI ROBOTIC ASSISTED  TOTAL HYSTERECTOMY WITH LEFT OOPHORECTOMY, BILATERAL SALPINGECTOMY, OVARIOLYSIS, ENTEROLYSIS,;  Surgeon: Ward, Honor Loh, MD;  Location: [facility];  Service: Gynecology;  Laterality: Bilateral;    Medical History: Past Medical History:  Diagnosis Date   Arthritis    Bipolar 1 disorder ([place])    younger, no meds now   Diabetes mellitus    Endometriosis    GERD (gastroesophageal reflux disease)    Headache     Family History: Family History  Problem Relation Age of Onset   Hypertension Mother    Hypertension Father    Heart disease Father    Bipolar disorder Father     Social History   Socioeconomic History   Marital status: Married    Spouse name: Not on file   Number of children: Not on file   Years of education: Not on file   Highest education level: Not on file  Occupational History   Not on file  Tobacco Use   Smoking status: Some Days    Packs/day: 0.10    Years: 12.00    Total pack years: 1.20    Types: Cigarettes   Smokeless tobacco: Never   Tobacco comments:    4-5 cigarettes a day  Vaping Use   Vaping Use: Some days  Substance and Sexual Activity   Alcohol use: Yes    Comment: rarely   Drug use: No   Sexual activity: Never  Other Topics Concern   Not on file  Social History Narrative   Not on file   Social Determinants of Health   Financial Resource Strain: Not on file  Food Insecurity: Not on file  Transportation Needs: Not on file  Physical Activity: Not on file  Stress: Not on file  Social Connections: Not on file  Intimate Partner Violence: Not on file      Review of Systems  Constitutional:  Positive for chills and fatigue. Negative for fever.  HENT:  Positive for congestion, postnasal drip and sinus pressure. Negative for mouth sores.   Respiratory:  Positive for cough, shortness of breath and wheezing.   Cardiovascular:  Negative for chest pain.  Genitourinary:  Negative for flank pain.  Neurological:  Positive for headaches.   Psychiatric/Behavioral: Negative.      Vital Signs: Resp 16   Ht '5\' 7"'$  (1.702 m)   LMP 02/27/2019   BMI 52.94 kg/m    Observation/Objective:  Pt is able to carry out conversation   Assessment/Plan: 1. Upper respiratory tract infection, unspecified type Will start on doxycycline and prednisone and may continue albuterol as needed. Also advised to use mucinex and nasal spray and to rest and stay well hydrated. Advised to call or go to ED if acute worsening. Chest xray may be needed if not improving. - predniSONE (DELTASONE) 10 MG tablet; Take one tab 3 x day for 3 days, then take one tab 2 x a day for 3 days and then take one tab a day for 3 days for copd  Dispense: 18 tablet; Refill: 0 - doxycycline (VIBRA-TABS) 100 MG tablet; Take 1 tablet (100 mg total) by mouth 2 (two) times daily.  Dispense: 20 tablet;  Refill: 0  2. Wheezing - predniSONE (DELTASONE) 10 MG tablet; Take one tab 3 x day for 3 days, then take one tab 2 x a day for 3 days and then take one tab a day for 3 days for copd  Dispense: 18 tablet; Refill: 0 - doxycycline (VIBRA-TABS) 100 MG tablet; Take 1 tablet (100 mg total) by mouth 2 (two) times daily.  Dispense: 20 tablet; Refill: 0  3. Acute cough - predniSONE (DELTASONE) 10 MG tablet; Take one tab 3 x day for 3 days, then take one tab 2 x a day for 3 days and then take one tab a day for 3 days for copd  Dispense: 18 tablet; Refill: 0 - doxycycline (VIBRA-TABS) 100 MG tablet; Take 1 tablet (100 mg total) by mouth 2 (two) times daily.  Dispense: 20 tablet; Refill: 0 - chlorpheniramine-HYDROcodone (TUSSIONEX) 10-8 MG/5ML; Take 5 mLs by mouth every 12 (twelve) hours as needed for cough.  Dispense: 140 mL; Refill: 0    General Counseling: Kenyata verbalizes understanding of the findings of today's phone visit and agrees with plan of treatment. I have discussed any further diagnostic evaluation that may be needed or ordered today. We also reviewed her medications today.  she has been encouraged to call the office with any questions or concerns that should arise related to todays visit.    No orders of the defined types were placed in this encounter.   Meds ordered this encounter  Medications   predniSONE (DELTASONE) 10 MG tablet    Sig: Take one tab 3 x day for 3 days, then take one tab 2 x a day for 3 days and then take one tab a day for 3 days for copd    Dispense:  18 tablet    Refill:  0   doxycycline (VIBRA-TABS) 100 MG tablet    Sig: Take 1 tablet (100 mg total) by mouth 2 (two) times daily.    Dispense:  20 tablet    Refill:  0   chlorpheniramine-HYDROcodone (TUSSIONEX) 10-8 MG/5ML    Sig: Take 5 mLs by mouth every 12 (twelve) hours as needed for cough.    Dispense:  140 mL    Refill:  0    Time spent:30 Minutes    Dr Lavera Guise Internal medicine

## 2021-11-25 ENCOUNTER — Ambulatory Visit (INDEPENDENT_AMBULATORY_CARE_PROVIDER_SITE_OTHER): Payer: Commercial Managed Care - HMO | Admitting: Physician Assistant

## 2021-11-25 ENCOUNTER — Encounter: Payer: Self-pay | Admitting: Physician Assistant

## 2021-11-25 VITALS — BP 178/106 | HR 88 | Temp 98.2°F | Resp 16 | Ht 67.0 in | Wt 348.0 lb

## 2021-11-25 DIAGNOSIS — I10 Essential (primary) hypertension: Secondary | ICD-10-CM | POA: Diagnosis not present

## 2021-11-25 DIAGNOSIS — F4310 Post-traumatic stress disorder, unspecified: Secondary | ICD-10-CM | POA: Diagnosis not present

## 2021-11-25 DIAGNOSIS — E1169 Type 2 diabetes mellitus with other specified complication: Secondary | ICD-10-CM | POA: Diagnosis not present

## 2021-11-25 DIAGNOSIS — Z6841 Body Mass Index (BMI) 40.0 and over, adult: Secondary | ICD-10-CM

## 2021-11-25 LAB — POCT GLYCOSYLATED HEMOGLOBIN (HGB A1C): Hemoglobin A1C: 6.1 % — AB (ref 4.0–5.6)

## 2021-11-25 MED ORDER — TRULICITY 0.75 MG/0.5ML ~~LOC~~ SOAJ: 0.7500 mg | SUBCUTANEOUS | 0 refills | Status: DC
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
## 2021-12-02 ENCOUNTER — Telehealth: Payer: Self-pay | Admitting: Physician Assistant

## 2021-12-02 NOTE — Telephone Encounter (Signed)
[facility] referral faxed to Dr. Nicolasa Ducking; [phone]

## 2021-12-23 ENCOUNTER — Encounter: Payer: Self-pay | Admitting: Physician Assistant

## 2021-12-23 ENCOUNTER — Ambulatory Visit (INDEPENDENT_AMBULATORY_CARE_PROVIDER_SITE_OTHER): Payer: Commercial Managed Care - HMO | Admitting: Physician Assistant

## 2021-12-23 ENCOUNTER — Telehealth: Payer: Self-pay | Admitting: Physician Assistant

## 2021-12-23 VITALS — BP 125/92 | HR 85 | Temp 98.5°F | Resp 16 | Ht 67.0 in | Wt 346.2 lb

## 2021-12-23 DIAGNOSIS — E1169 Type 2 diabetes mellitus with other specified complication: Secondary | ICD-10-CM

## 2021-12-23 DIAGNOSIS — I10 Essential (primary) hypertension: Secondary | ICD-10-CM

## 2021-12-23 DIAGNOSIS — G4710 Hypersomnia, unspecified: Secondary | ICD-10-CM | POA: Diagnosis not present

## 2021-12-23 DIAGNOSIS — Z6841 Body Mass Index (BMI) 40.0 and over, adult: Secondary | ICD-10-CM

## 2021-12-23 MED ORDER — LISINOPRIL 5 MG PO TABS: 5.0000 mg | ORAL_TABLET | Freq: Every day | ORAL | 3 refills | Status: DC

## 2021-12-23 MED ORDER — TIRZEPATIDE 2.5 MG/0.5ML ~~LOC~~ SOAJ: 2.5000 mg | SUBCUTANEOUS | 0 refills | Status: DC

## 2021-12-23 MED ORDER — AMLODIPINE BESYLATE 10 MG PO TABS: 10.0000 mg | ORAL_TABLET | Freq: Every day | ORAL | 3 refills | Status: DC

## 2021-12-23 NOTE — Progress Notes (Signed)
[facility] [place], [place] [address]  Internal MEDICINE  Office Visit Note  Patient Name: Tara Estes  [id number]  [id number]  Date of Service: 12/23/2021  Chief Complaint  Patient presents with   Follow-up   Diabetes   Gastroesophageal Reflux    HPI Pt is here for routine follow up -Down 2 lbs since last visit -Reports trulicity injections burn when she takes them and are very uncomfortable. She did not have this problem with mounjaro and would like to go back to it if insurance will cover again -Had a tooth pulled Monday, dealing with Jaw pain still -Still having sinus congestion causing headaches. Not taking allergy medication and notices dust at hospital seems to trigger it. Does recall taking an antihistamine one time from relative and it did help. Will try zyrtec or allegra and nasal spray as needed though reports nasacort isnt helping much currently. Did also discuss headaches can be from untreated OSA as well. 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 Believes it is trazodone. Unfortunately she is out of network and did pay out of pocket for that visit. -Continues to have snoring, gasping, witnessed apneas, morning headaches, and has Fhx of OSA. Previously her insurance did not cover sleep study and would like to revisit now that she has different insurance. She may call to ask about coverage of studies and cpap. -BP much better since taking '10mg'$  amlodipine since she has been consistent with this. Has taken triam-hctz as needed on days she isnt working but wont take this before work due to needing to use the restroom too frequently. Will add lisinopril to amlodipine instead for daily Bp control.  EPWORTH SLEEPINESS SCALE:  Scale:  (0)= no chance of dozing; (1)= slight chance of dozing; (2)= moderate chance of dozing; (3)= high  chance of dozing  Chance  Situtation    Sitting and reading: 3    Watching TV: 3    Sitting Inactive in public: 0    As a passenger in car: 0      Lying down to rest: 1    Sitting and talking: 0    Sitting quielty after lunch: 2    In a car, stopped in traffic: 0   TOTAL SCORE:   9 out of 24   Current Medication: Outpatient Encounter Medications as of 12/23/2021  Medication Sig   acetaminophen (TYLENOL) 500 MG tablet Take 1,000 mg by mouth every 6 (six) hours as needed.   albuterol (VENTOLIN HFA) 108 (90 Base) MCG/ACT inhaler Inhale 2 puffs into the lungs every 4 (four) hours as needed for wheezing or shortness of breath.   amLODipine (NORVASC) 10 MG tablet Take 1 tablet (10 mg total) by mouth daily.   chlorpheniramine-HYDROcodone (TUSSIONEX) 10-8 MG/5ML Take 5 mLs by mouth every 12 (twelve) hours as needed for cough.   doxycycline (VIBRA-TABS) 100 MG tablet Take 1 tablet (100 mg total) by mouth 2 (two) times daily.   etonogestrel (NEXPLANON) 68 MG IMPL implant 1 each by Subdermal route once.   hydroxychloroquine (PLAQUENIL) 200 MG tablet Take 2 tablets (400 mg total) by mouth daily.   lisinopril (ZESTRIL) 5 MG tablet Take 1 tablet (5 mg total) by mouth daily.   methocarbamol (ROBAXIN) 500 MG tablet TAKE ONE TABLET BY MOUTH AT BEDTIME   omeprazole (PRILOSEC) 40 MG  capsule Take 1 capsule (40 mg total) by mouth daily.   predniSONE (DELTASONE) 10 MG tablet Take one tab 3 x day for 3 days, then take one tab 2 x a day for 3 days and then take one tab a day for 3 days for copd   tirzepatide (MOUNJARO) 2.5 MG/0.5ML Pen Inject 2.5 mg into the skin once a week.   triamterene-hydrochlorothiazide (DYAZIDE) 37.5-25 MG capsule Take 1 each (1 capsule total) by mouth daily.   [DISCONTINUED] amLODipine (NORVASC) 5 MG tablet Take 1 tablet (5 mg total) by mouth daily.   [DISCONTINUED] Dulaglutide (TRULICITY) 9.32 IZ/1.2WP SOPN Inject 0.75 mg into the skin once a week.   No  facility-administered encounter medications on file as of 12/23/2021.    Surgical History: Past Surgical History:  Procedure Laterality Date   CYSTOSCOPY W/ URETERAL STENT PLACEMENT Bilateral 04/02/2019   Procedure: CYSTOSCOPY WITH RETROGRADE PYELOGRAM/URETERAL STENT PLACEMENT;  Surgeon: Hollice Espy, MD;  Location: [facility];  Service: Urology;  Laterality: Bilateral;   CYSTOSCOPY W/ URETERAL STENT REMOVAL Bilateral 04/02/2019   Procedure: CYSTOSCOPY WITH STENT REMOVAL;  Surgeon: Ward, Honor Loh, MD;  Location: [facility];  Service: Gynecology;  Laterality: Bilateral;   head injury     requiring closure   LAPAROSCOPY N/A 03/07/2019   Procedure: LAPAROSCOPY DIAGNOSTIC, lysis of adhesions;  Surgeon: Ward, Honor Loh, MD;  Location: [facility];  Service: Gynecology;  Laterality: N/A;   ROBOTIC ASSISTED LAPAROSCOPIC OVARIAN CYSTECTOMY Right 04/02/2019   Procedure: XI ROBOTIC ASSISTED LAPAROSCOPIC OVARIAN CYSTECTOMY;  Surgeon: Ward, Honor Loh, MD;  Location: [facility];  Service: Gynecology;  Laterality: Right;   ROBOTIC ASSISTED TOTAL HYSTERECTOMY WITH BILATERAL SALPINGO OOPHERECTOMY Bilateral 04/02/2019   Procedure: XI ROBOTIC ASSISTED TOTAL HYSTERECTOMY WITH LEFT OOPHORECTOMY, BILATERAL SALPINGECTOMY, OVARIOLYSIS, ENTEROLYSIS,;  Surgeon: Ward, Honor Loh, MD;  Location: [facility];  Service: Gynecology;  Laterality: Bilateral;    Medical History: Past Medical History:  Diagnosis Date   Arthritis    Bipolar 1 disorder ([place])    younger, no meds now   Diabetes mellitus    Endometriosis    GERD (gastroesophageal reflux disease)    Headache    Hypertension     Family History: Family History  Problem Relation Age of Onset   Hypertension Mother    Hypertension Father    Heart disease Father    Bipolar disorder Father     Social History   Socioeconomic History   Marital status: Married    Spouse name: Not on file   Number of children: Not on file   Years of education: Not on file   Highest  education level: Not on file  Occupational History   Not on file  Tobacco Use   Smoking status: Some Days    Packs/day: 0.10    Years: 12.00    Total pack years: 1.20    Types: E-cigarettes, Cigarettes   Smokeless tobacco: Never  Vaping Use   Vaping Use: Every day  Substance and Sexual Activity   Alcohol use: Yes    Comment: rarely   Drug use: No   Sexual activity: Never  Other Topics Concern   Not on file  Social History Narrative   Not on file   Social Determinants of Health   Financial Resource Strain: Not on file  Food Insecurity: Not on file  Transportation Needs: Not on file  Physical Activity: Not on file  Stress: Not on file  Social Connections: Not on file  Intimate Partner Violence: Not on  file      Review of Systems  Constitutional:  Positive for fatigue. Negative for chills and unexpected weight change.  HENT:  Positive for congestion and postnasal drip. Negative for rhinorrhea, sneezing and sore throat.   Eyes:  Negative for redness.  Respiratory:  Negative for cough, chest tightness and shortness of breath.   Cardiovascular:  Negative for chest pain and palpitations.  Gastrointestinal:  Negative for abdominal pain, constipation, diarrhea, nausea and vomiting.  Genitourinary:  Negative for dysuria and frequency.  Musculoskeletal:  Positive for arthralgias and back pain. Negative for joint swelling and neck pain.  Skin:  Negative for rash.  Allergic/Immunologic: Positive for environmental allergies.  Neurological: Negative.  Negative for tremors and numbness.  Hematological:  Negative for adenopathy. Does not bruise/bleed easily.  Psychiatric/Behavioral:  Positive for sleep disturbance. Negative for behavioral problems (Depression) and suicidal ideas. The patient is nervous/anxious.     Vital Signs: BP (!) 125/92 Comment: 138/92  Pulse 85   Temp 98.5 F (36.9 C)   Resp 16   Ht '5\' 7"'$  (1.702 m)   Wt (!) 346 lb 3.2 oz (157 kg)   LMP 02/27/2019    SpO2 99%   BMI 54.22 kg/m    Physical Exam Vitals and nursing note reviewed.  Constitutional:      General: She is not in acute distress.    Appearance: Normal appearance. She is well-developed. She is obese. She is not diaphoretic.  HENT:     Head: Normocephalic and atraumatic.     Mouth/Throat:     Pharynx: No oropharyngeal exudate.  Eyes:     Pupils: Pupils are equal, round, and reactive to light.  Neck:     Thyroid: No thyromegaly.     Vascular: No JVD.     Trachea: No tracheal deviation.  Cardiovascular:     Rate and Rhythm: Normal rate and regular rhythm.     Heart sounds: Normal heart sounds. No murmur heard.    No friction rub. No gallop.  Pulmonary:     Effort: Pulmonary effort is normal. No respiratory distress.     Breath sounds: No wheezing or rales.  Chest:     Chest wall: No tenderness.  Abdominal:     General: Bowel sounds are normal.     Palpations: Abdomen is soft.  Musculoskeletal:        General: Normal range of motion.     Cervical back: Normal range of motion and neck supple.  Lymphadenopathy:     Cervical: No cervical adenopathy.  Skin:    General: Skin is warm and dry.  Neurological:     Mental Status: She is alert and oriented to person, place, and time.     Cranial Nerves: No cranial nerve deficit.  Psychiatric:        Behavior: Behavior normal.        Thought Content: Thought content normal.        Judgment: Judgment normal.        Assessment/Plan: 1. Essential (primary) hypertension Will continue amlodipine and start lisinopril since unable to take diuretic regularly with her job. Continue to monitor - amLODipine (NORVASC) 10 MG tablet; Take 1 tablet (10 mg total) by mouth daily.  Dispense: 90 tablet; Refill: 3 - lisinopril (ZESTRIL) 5 MG tablet; Take 1 tablet (5 mg total) by mouth daily.  Dispense: 90 tablet; Refill: 3  2. Type 2 diabetes mellitus with other specified complication, without long-term current use of insulin  ([place]) Will try  to switch back to [employer] as this was better tolerated than trulicity - Urine Microalbumin w/creat. ratio - tirzepatide (MOUNJARO) 2.5 MG/0.5ML Pen; Inject 2.5 mg into the skin once a week.  Dispense: 2 mL; Refill: 0  3. Hypersomnia Due to daytime fatigue, snoring, gasping, witnessed apneas, morning headaches, Fhx of OSA, and elevated BMI will order PSG - PSG SLEEP STUDY; Future  4. Morbid obesity with BMI of 50.0-59.9, adult (HCC) Will eval for OSA and will also start mounjaro to aid BG control and wt loss. Continue to work on diet and exercise as well.   General Counseling: Judye verbalizes understanding of the findings of todays visit and agrees with plan of treatment. I have discussed any further diagnostic evaluation that may be needed or ordered today. We also reviewed her medications today. she has been encouraged to call the office with any questions or concerns that should arise related to todays visit.    Orders Placed This Encounter  Procedures   Urine Microalbumin w/creat. ratio   PSG SLEEP STUDY    Meds ordered this encounter  Medications   tirzepatide (MOUNJARO) 2.5 MG/0.5ML Pen    Sig: Inject 2.5 mg into the skin once a week.    Dispense:  2 mL    Refill:  0   amLODipine (NORVASC) 10 MG tablet    Sig: Take 1 tablet (10 mg total) by mouth daily.    Dispense:  90 tablet    Refill:  3   lisinopril (ZESTRIL) 5 MG tablet    Sig: Take 1 tablet (5 mg total) by mouth daily.    Dispense:  90 tablet    Refill:  3    This patient was seen by Drema Dallas, PA-C in collaboration with Dr. Clayborn Bigness as a part of collaborative care agreement.   Total time spent:30 Minutes Time spent includes review of chart, medications, test results, and follow up plan with the patient.      Dr Lavera Guise Internal medicine

## 2021-12-23 NOTE — Telephone Encounter (Signed)
Awaiting 12/23/21 office notes for SS order-Toni

## 2021-12-27 ENCOUNTER — Telehealth: Payer: Self-pay | Admitting: Physician Assistant

## 2021-12-27 NOTE — Telephone Encounter (Signed)
SS order placed in [facility] folder-Toni 

## 2022-01-03 ENCOUNTER — Telehealth: Payer: Self-pay | Admitting: Physician Assistant

## 2022-01-03 NOTE — Telephone Encounter (Signed)
Patient declined sleep study due to cost. tat

## 2022-01-30 ENCOUNTER — Ambulatory Visit (INDEPENDENT_AMBULATORY_CARE_PROVIDER_SITE_OTHER): Payer: Commercial Managed Care - PPO | Admitting: Physician Assistant

## 2022-01-30 ENCOUNTER — Encounter: Payer: Self-pay | Admitting: Physician Assistant

## 2022-01-30 VITALS — BP 130/84 | HR 106 | Temp 98.4°F | Resp 16 | Ht 67.0 in | Wt 348.4 lb

## 2022-01-30 DIAGNOSIS — I10 Essential (primary) hypertension: Secondary | ICD-10-CM

## 2022-01-30 DIAGNOSIS — Z6841 Body Mass Index (BMI) 40.0 and over, adult: Secondary | ICD-10-CM | POA: Diagnosis not present

## 2022-01-30 DIAGNOSIS — E1169 Type 2 diabetes mellitus with other specified complication: Secondary | ICD-10-CM

## 2022-01-30 MED ORDER — TIRZEPATIDE 2.5 MG/0.5ML ~~LOC~~ SOAJ: 2.5000 mg | SUBCUTANEOUS | 0 refills | Status: DC

## 2022-01-30 NOTE — Progress Notes (Signed)
[facility] [place], [place] [address]  Internal MEDICINE  Office Visit Note  Patient Name: Tara Estes  [id number]  [id number]  Date of Service: 02/04/2022  Chief Complaint  Patient presents with   Follow-up   Diabetes   Gastroesophageal Reflux   Hypertension    HPI Pt is here for routine follow up -Due for meds now -'10mg'$  amlodipine, taking triamterene-hctz on days off but can't take this with work. Hasn't started lisinopril now. May try fluid pill on days off and lisinopril on work days for better control while still removing fluid. She is going to monitor BP at work as well -Still going to psych in [place], too costly locally -Taking trazodone for sleep now -Never got back on mounjaro as pharmacy told her they would be sending [place] a PA, but we never received it. Will resend and pt will check with pharmacy. -Sleep study still too expensive under new insurance  Current Medication: Outpatient Encounter Medications as of 01/30/2022  Medication Sig   acetaminophen (TYLENOL) 500 MG tablet Take 1,000 mg by mouth every 6 (six) hours as needed.   albuterol (VENTOLIN HFA) 108 (90 Base) MCG/ACT inhaler Inhale 2 puffs into the lungs every 4 (four) hours as needed for wheezing or shortness of breath.   amLODipine (NORVASC) 10 MG tablet Take 1 tablet (10 mg total) by mouth daily.   chlorpheniramine-HYDROcodone (TUSSIONEX) 10-8 MG/5ML Take 5 mLs by mouth every 12 (twelve) hours as needed for cough.   doxycycline (VIBRA-TABS) 100 MG tablet Take 1 tablet (100 mg total) by mouth 2 (two) times daily.   etonogestrel (NEXPLANON) 68 MG IMPL implant 1 each by Subdermal route once.   hydroxychloroquine (PLAQUENIL) 200 MG tablet Take 2 tablets (400 mg total) by mouth daily.   lisinopril (ZESTRIL) 5 MG tablet Take 1 tablet (5 mg total) by mouth daily.   methocarbamol (ROBAXIN) 500 MG tablet TAKE ONE TABLET BY MOUTH AT BEDTIME   omeprazole (PRILOSEC) 40 MG capsule Take 1  capsule (40 mg total) by mouth daily.   predniSONE (DELTASONE) 10 MG tablet Take one tab 3 x day for 3 days, then take one tab 2 x a day for 3 days and then take one tab a day for 3 days for copd   traZODone (DESYREL) 50 MG tablet Take 50 mg by mouth at bedtime.   triamterene-hydrochlorothiazide (DYAZIDE) 37.5-25 MG capsule Take 1 each (1 capsule total) by mouth daily.   [DISCONTINUED] tirzepatide [facility]) 2.5 MG/0.5ML Pen Inject 2.5 mg into the skin once a week.   tirzepatide [facility]) 2.5 MG/0.5ML Pen Inject 2.5 mg into the skin once a week.   No facility-administered encounter medications on file as of 01/30/2022.    Surgical History: Past Surgical History:  Procedure Laterality Date   CYSTOSCOPY W/ URETERAL STENT PLACEMENT Bilateral 04/02/2019   Procedure: CYSTOSCOPY WITH RETROGRADE PYELOGRAM/URETERAL STENT PLACEMENT;  Surgeon: Hollice Espy, MD;  Location: [facility];  Service: Urology;  Laterality: Bilateral;   CYSTOSCOPY W/ URETERAL STENT REMOVAL Bilateral 04/02/2019   Procedure: CYSTOSCOPY WITH STENT REMOVAL;  Surgeon: Ward, Honor Loh, MD;  Location: [facility];  Service: Gynecology;  Laterality: Bilateral;   head injury     requiring closure   LAPAROSCOPY N/A 03/07/2019   Procedure: LAPAROSCOPY DIAGNOSTIC, lysis of adhesions;  Surgeon: Ward, Honor Loh, MD;  Location: [facility];  Service: Gynecology;  Laterality: N/A;   ROBOTIC ASSISTED LAPAROSCOPIC OVARIAN CYSTECTOMY Right 04/02/2019   Procedure: XI ROBOTIC ASSISTED LAPAROSCOPIC OVARIAN CYSTECTOMY;  Surgeon: Ward, Honor Loh, MD;  Location: [facility];  Service: Gynecology;  Laterality: Right;   ROBOTIC ASSISTED TOTAL HYSTERECTOMY WITH BILATERAL SALPINGO OOPHERECTOMY Bilateral 04/02/2019   Procedure: XI ROBOTIC ASSISTED TOTAL HYSTERECTOMY WITH LEFT OOPHORECTOMY, BILATERAL SALPINGECTOMY, OVARIOLYSIS, ENTEROLYSIS,;  Surgeon: Ward, Honor Loh, MD;  Location: [facility];  Service: Gynecology;  Laterality: Bilateral;    Medical History: Past  Medical History:  Diagnosis Date   Arthritis    Bipolar 1 disorder ([place])    younger, no meds now   Diabetes mellitus    Endometriosis    GERD (gastroesophageal reflux disease)    Headache    Hypertension     Family History: Family History  Problem Relation Age of Onset   Hypertension Mother    Hypertension Father    Heart disease Father    Bipolar disorder Father     Social History   Socioeconomic History   Marital status: Married    Spouse name: Not on file   Number of children: Not on file   Years of education: Not on file   Highest education level: Not on file  Occupational History   Not on file  Tobacco Use   Smoking status: Some Days    Packs/day: 0.10    Years: 12.00    Total pack years: 1.20    Types: E-cigarettes, Cigarettes   Smokeless tobacco: Never  Vaping Use   Vaping Use: Every day  Substance and Sexual Activity   Alcohol use: Yes    Comment: rarely   Drug use: No   Sexual activity: Never  Other Topics Concern   Not on file  Social History Narrative   Not on file   Social Determinants of Health   Financial Resource Strain: Not on file  Food Insecurity: Not on file  Transportation Needs: Not on file  Physical Activity: Not on file  Stress: Not on file  Social Connections: Not on file  Intimate Partner Violence: Not on file      Review of Systems  Constitutional:  Positive for fatigue. Negative for chills and unexpected weight change.  HENT:  Negative for congestion, postnasal drip, rhinorrhea, sneezing and sore throat.   Eyes:  Negative for redness.  Respiratory:  Negative for cough, chest tightness and shortness of breath.   Cardiovascular:  Negative for chest pain and palpitations.  Gastrointestinal:  Negative for abdominal pain, constipation, diarrhea, nausea and vomiting.  Genitourinary:  Negative for dysuria and frequency.  Musculoskeletal:  Positive for arthralgias and back pain. Negative for joint swelling and neck pain.   Skin:  Negative for rash.  Neurological: Negative.  Negative for tremors and numbness.  Hematological:  Negative for adenopathy. Does not bruise/bleed easily.  Psychiatric/Behavioral:  Positive for sleep disturbance. Negative for behavioral problems (Depression) and suicidal ideas. The patient is nervous/anxious.     Vital Signs: BP 130/84   Pulse (!) 106   Temp 98.4 F (36.9 C)   Resp 16   Ht '5\' 7"'$  (1.702 m)   Wt (!) 348 lb 6.4 oz (158 kg)   LMP 02/27/2019   SpO2 99%   BMI 54.57 kg/m    Physical Exam Vitals and nursing note reviewed.  Constitutional:      General: She is not in acute distress.    Appearance: Normal appearance. She is well-developed. She is obese. She is not diaphoretic.  HENT:     Head: Normocephalic and atraumatic.     Mouth/Throat:     Pharynx: No oropharyngeal exudate.  Eyes:     Pupils: Pupils are equal, round, and reactive to light.  Neck:     Thyroid: No thyromegaly.     Vascular: No JVD.     Trachea: No tracheal deviation.  Cardiovascular:     Rate and Rhythm: Normal rate and regular rhythm.     Heart sounds: Normal heart sounds. No murmur heard.    No friction rub. No gallop.  Pulmonary:     Effort: Pulmonary effort is normal. No respiratory distress.     Breath sounds: No wheezing or rales.  Chest:     Chest wall: No tenderness.  Abdominal:     General: Bowel sounds are normal.     Palpations: Abdomen is soft.  Musculoskeletal:        General: Normal range of motion.     Cervical back: Normal range of motion and neck supple.  Lymphadenopathy:     Cervical: No cervical adenopathy.  Skin:    General: Skin is warm and dry.  Neurological:     Mental Status: She is alert and oriented to person, place, and time.     Cranial Nerves: No cranial nerve deficit.  Psychiatric:        Behavior: Behavior normal.        Thought Content: Thought content normal.        Judgment: Judgment normal.        Assessment/Plan: 1. Type 2 diabetes  mellitus with other specified complication, without long-term current use of insulin (HCC) Will resend mounjaro and complete PA if indicated. Pt had side effect to trulicity, but previously tolerated mounjaro and would like to restart with new insurance. - tirzepatide [facility]) 2.5 MG/0.5ML Pen; Inject 2.5 mg into the skin once a week.  Dispense: 2 mL; Refill: 0  2. Essential (primary) hypertension Improved, continue amlodipine daily and consider lisinopril on work days if bp rising and unable to take diuretic.  3. Morbid obesity with BMI of 50.0-59.9, adult ([place]) Continue to work on diet and exercise and will start mounjaro to aid BG control and wt loss   General Counseling: Maura verbalizes understanding of the findings of todays visit and agrees with plan of treatment. I have discussed any further diagnostic evaluation that may be needed or ordered today. We also reviewed her medications today. she has been encouraged to call the office with any questions or concerns that should arise related to todays visit.    No orders of the defined types were placed in this encounter.   Meds ordered this encounter  Medications   tirzepatide (MOUNJARO) 2.5 MG/0.5ML Pen    Sig: Inject 2.5 mg into the skin once a week.    Dispense:  2 mL    Refill:  0    This patient was seen by Drema Dallas, PA-C in collaboration with Dr. Clayborn Bigness as a part of collaborative care agreement.   Total time spent:30 Minutes Time spent includes review of chart, medications, test results, and follow up plan with the patient.      Dr Lavera Guise Internal medicine

## 2022-02-13 ENCOUNTER — Telehealth: Payer: Self-pay

## 2022-02-13 NOTE — Telephone Encounter (Signed)
Completed verbal prior authorization for patient and faxed office visit note to [phone].

## 2022-03-16 ENCOUNTER — Ambulatory Visit (INDEPENDENT_AMBULATORY_CARE_PROVIDER_SITE_OTHER): Payer: Commercial Managed Care - PPO | Admitting: Physician Assistant

## 2022-03-16 ENCOUNTER — Encounter: Payer: Self-pay | Admitting: Physician Assistant

## 2022-03-16 VITALS — BP 127/100 | HR 84 | Temp 97.8°F | Resp 16 | Ht 67.0 in | Wt 347.8 lb

## 2022-03-16 DIAGNOSIS — R200 Anesthesia of skin: Secondary | ICD-10-CM

## 2022-03-16 DIAGNOSIS — Z6841 Body Mass Index (BMI) 40.0 and over, adult: Secondary | ICD-10-CM

## 2022-03-16 DIAGNOSIS — R202 Paresthesia of skin: Secondary | ICD-10-CM | POA: Diagnosis not present

## 2022-03-16 DIAGNOSIS — I10 Essential (primary) hypertension: Secondary | ICD-10-CM | POA: Diagnosis not present

## 2022-03-16 DIAGNOSIS — M25511 Pain in right shoulder: Secondary | ICD-10-CM

## 2022-03-16 MED ORDER — PREDNISONE 10 MG PO TABS: ORAL_TABLET | ORAL | 0 refills | Status: DC

## 2022-03-16 MED ORDER — METHOCARBAMOL 500 MG PO TABS: 500.0000 mg | ORAL_TABLET | Freq: Every day | ORAL | 0 refills | Status: DC

## 2022-03-16 NOTE — Progress Notes (Signed)
[facility] [place], [place] [address]  Internal MEDICINE  Office Visit Note  Patient Name: Tara Estes  [id number]  [id number]  Date of Service: 03/21/2022  Chief Complaint  Patient presents with   Follow-up   Diabetes   Gastroesophageal Reflux   Hypertension    HPI Pt is here for routine follow up -Taking amlodipine every day. 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 She has not taken BP meds yet today. -may be considering switching insurance. She states she has been talking with them about mounjaro as we completed PA and havent heard back, but she states they don't want to cover it despite S/E with trulicity and metformin already -Going to the gym 3 times per week -Right hand has been bothering her for 1 month. Hand will go to sleep and feel numb. Difficulty with making complete fist as of this morning. Has been doing NSAIDs and tens unit. Pain in right shoulder blade as well. She does report she has not been back to rheumatology since before she lost and then regained insurance and may need to consider follow up as this could be related. No injuries she is aware of. Will give round of steroid to help inflammation and muscle relaxer as needed. Advised to call if new or worsening symptoms  Current Medication: Outpatient Encounter Medications as of 03/16/2022  Medication Sig   acetaminophen (TYLENOL) 500 MG tablet Take 1,000 mg by mouth every 6 (six) hours as needed.   albuterol (VENTOLIN HFA) 108 (90 Base) MCG/ACT inhaler Inhale 2 puffs into the lungs every 4 (four) hours as needed for wheezing or shortness of breath.   amLODipine (NORVASC) 10 MG tablet Take 1 tablet (10 mg total) by mouth daily.   chlorpheniramine-HYDROcodone (TUSSIONEX) 10-8 MG/5ML Take 5 mLs by mouth every 12 (twelve) hours as needed for cough.   doxycycline (VIBRA-TABS) 100 MG tablet Take 1 tablet (100 mg total) by mouth 2 (two) times daily.   etonogestrel (NEXPLANON)  68 MG IMPL implant 1 each by Subdermal route once.   FLUoxetine (PROZAC) 20 MG capsule Take 20 mg by mouth daily.   hydroxychloroquine (PLAQUENIL) 200 MG tablet Take 2 tablets (400 mg total) by mouth daily.   lisinopril (ZESTRIL) 5 MG tablet Take 1 tablet (5 mg total) by mouth daily.   omeprazole (PRILOSEC) 40 MG capsule Take 1 capsule (40 mg total) by mouth daily.   predniSONE (DELTASONE) 10 MG tablet Use per dose pack   tirzepatide (MOUNJARO) 2.5 MG/0.5ML Pen Inject 2.5 mg into the skin once a week.   traZODone (DESYREL) 50 MG tablet Take 50 mg by mouth at bedtime.   triamterene-hydrochlorothiazide (DYAZIDE) 37.5-25 MG capsule Take 1 each (1 capsule total) by mouth daily.   [DISCONTINUED] methocarbamol (ROBAXIN) 500 MG tablet TAKE ONE TABLET BY MOUTH AT BEDTIME   [DISCONTINUED] predniSONE (DELTASONE) 10 MG tablet Take one tab 3 x day for 3 days, then take one tab 2 x a day for 3 days and then take one tab a day for 3 days for copd   methocarbamol (ROBAXIN) 500 MG tablet Take 1 tablet (500 mg total) by mouth at bedtime.   No facility-administered encounter medications on file as of 03/16/2022.    Surgical History: Past Surgical History:  Procedure Laterality Date   CYSTOSCOPY W/ URETERAL STENT PLACEMENT Bilateral 04/02/2019   Procedure: CYSTOSCOPY WITH RETROGRADE PYELOGRAM/URETERAL STENT PLACEMENT;  Surgeon: Hollice Espy, MD;  Location: [facility];  Service: Urology;  Laterality: Bilateral;  CYSTOSCOPY W/ URETERAL STENT REMOVAL Bilateral 04/02/2019   Procedure: CYSTOSCOPY WITH STENT REMOVAL;  Surgeon: Ward, Honor Loh, MD;  Location: [facility];  Service: Gynecology;  Laterality: Bilateral;   head injury     requiring closure   LAPAROSCOPY N/A 03/07/2019   Procedure: LAPAROSCOPY DIAGNOSTIC, lysis of adhesions;  Surgeon: Ward, Honor Loh, MD;  Location: [facility];  Service: Gynecology;  Laterality: N/A;   ROBOTIC ASSISTED LAPAROSCOPIC OVARIAN CYSTECTOMY Right 04/02/2019   Procedure: XI ROBOTIC  ASSISTED LAPAROSCOPIC OVARIAN CYSTECTOMY;  Surgeon: Ward, Honor Loh, MD;  Location: [facility];  Service: Gynecology;  Laterality: Right;   ROBOTIC ASSISTED TOTAL HYSTERECTOMY WITH BILATERAL SALPINGO OOPHERECTOMY Bilateral 04/02/2019   Procedure: XI ROBOTIC ASSISTED TOTAL HYSTERECTOMY WITH LEFT OOPHORECTOMY, BILATERAL SALPINGECTOMY, OVARIOLYSIS, ENTEROLYSIS,;  Surgeon: Ward, Honor Loh, MD;  Location: [facility];  Service: Gynecology;  Laterality: Bilateral;    Medical History: Past Medical History:  Diagnosis Date   Arthritis    Bipolar 1 disorder ([place])    younger, no meds now   Diabetes mellitus    Endometriosis    GERD (gastroesophageal reflux disease)    Headache    Hypertension     Family History: Family History  Problem Relation Age of Onset   Hypertension Mother    Hypertension Father    Heart disease Father    Bipolar disorder Father     Social History   Socioeconomic History   Marital status: Married    Spouse name: Not on file   Number of children: Not on file   Years of education: Not on file   Highest education level: Not on file  Occupational History   Not on file  Tobacco Use   Smoking status: Some Days    Packs/day: 0.10    Years: 12.00    Total pack years: 1.20    Types: E-cigarettes, Cigarettes   Smokeless tobacco: Never  Vaping Use   Vaping Use: Every day  Substance and Sexual Activity   Alcohol use: Yes    Comment: rarely   Drug use: No   Sexual activity: Never  Other Topics Concern   Not on file  Social History Narrative   Not on file   Social Determinants of Health   Financial Resource Strain: Not on file  Food Insecurity: Not on file  Transportation Needs: Not on file  Physical Activity: Not on file  Stress: Not on file  Social Connections: Not on file  Intimate Partner Violence: Not on file      Review of Systems  Constitutional:  Positive for fatigue. Negative for chills and unexpected weight change.  HENT:  Negative for  congestion, postnasal drip, rhinorrhea, sneezing and sore throat.   Eyes:  Negative for redness.  Respiratory:  Negative for cough, chest tightness and shortness of breath.   Cardiovascular:  Negative for chest pain and palpitations.  Gastrointestinal:  Negative for abdominal pain, constipation, diarrhea, nausea and vomiting.  Genitourinary:  Negative for dysuria and frequency.  Musculoskeletal:  Positive for arthralgias, back pain and joint swelling. Negative for neck pain.  Skin:  Negative for rash.  Neurological: Negative.  Negative for tremors and numbness.  Hematological:  Negative for adenopathy. Does not bruise/bleed easily.  Psychiatric/Behavioral:  Positive for sleep disturbance. Negative for behavioral problems (Depression) and suicidal ideas. The patient is nervous/anxious.     Vital Signs: BP (!) 127/100   Pulse 84   Temp 97.8 F (36.6 C)   Resp 16   Ht '5\' 7"'$  (1.702  m)   Wt (!) 347 lb 12.8 oz (157.8 kg)   LMP 02/27/2019   SpO2 99%   BMI 54.47 kg/m    Physical Exam Vitals and nursing note reviewed.  Constitutional:      General: She is not in acute distress.    Appearance: Normal appearance. She is well-developed. She is obese. She is not diaphoretic.  HENT:     Head: Normocephalic and atraumatic.     Mouth/Throat:     Pharynx: No oropharyngeal exudate.  Eyes:     Pupils: Pupils are equal, round, and reactive to light.  Neck:     Thyroid: No thyromegaly.     Vascular: No JVD.     Trachea: No tracheal deviation.  Cardiovascular:     Rate and Rhythm: Normal rate and regular rhythm.     Heart sounds: Normal heart sounds. No murmur heard.    No friction rub. No gallop.  Pulmonary:     Effort: Pulmonary effort is normal. No respiratory distress.     Breath sounds: No wheezing or rales.  Chest:     Chest wall: No tenderness.  Abdominal:     General: Bowel sounds are normal.     Palpations: Abdomen is soft.  Musculoskeletal:        General: Swelling and  tenderness present. Normal range of motion.     Cervical back: Normal range of motion and neck supple.     Comments: Decreased ROM in right hand with some numbness, tenderness in right shoulder blade as well  Lymphadenopathy:     Cervical: No cervical adenopathy.  Skin:    General: Skin is warm and dry.  Neurological:     Mental Status: She is alert and oriented to person, place, and time.     Cranial Nerves: No cranial nerve deficit.  Psychiatric:        Behavior: Behavior normal.        Thought Content: Thought content normal.        Judgment: Judgment normal.        Assessment/Plan: 1. Acute pain of right shoulder May use robaxin as needed before bed and will start on round prednisone due to possible inflammation  - methocarbamol (ROBAXIN) 500 MG tablet; Take 1 tablet (500 mg total) by mouth at bedtime.  Dispense: 30 tablet; Refill: 0 - predniSONE (DELTASONE) 10 MG tablet; Use per dose pack  Dispense: 21 tablet; Refill: 0  2. Numbness and tingling in right hand May use robaxin as needed before bed and will start on round prednisone due to possible inflammation as underlying cause. Call or go to ED if new or worsening symptoms. May need rheumatology follow up or consider neurology referral   - methocarbamol (ROBAXIN) 500 MG tablet; Take 1 tablet (500 mg total) by mouth at bedtime.  Dispense: 30 tablet; Refill: 0 - predniSONE (DELTASONE) 10 MG tablet; Use per dose pack  Dispense: 21 tablet; Refill: 0  3. Essential (primary) hypertension Elevated in office, but has not taken medications yet. Will do so now and continue to monitor  4. Morbid obesity with BMI of 50.0-59.9, adult ([place]) Obesity Counseling: Had a lengthy discussion regarding patients BMI and weight issues. Patient was instructed on portion control as well as increased activity. Also discussed caloric restrictions with trying to maintain intake less than 2000 Kcal. Discussions were made in accordance with the 5As of  weight management. Simple actions such as not eating late and if able to, taking a walk is  suggested.    General Counseling: Aunica verbalizes understanding of the findings of todays visit and agrees with plan of treatment. I have discussed any further diagnostic evaluation that may be needed or ordered today. We also reviewed her medications today. she has been encouraged to call the office with any questions or concerns that should arise related to todays visit.    No orders of the defined types were placed in this encounter.   Meds ordered this encounter  Medications   methocarbamol (ROBAXIN) 500 MG tablet    Sig: Take 1 tablet (500 mg total) by mouth at bedtime.    Dispense:  30 tablet    Refill:  0   predniSONE (DELTASONE) 10 MG tablet    Sig: Use per dose pack    Dispense:  21 tablet    Refill:  0    This patient was seen by Drema Dallas, PA-C in collaboration with Dr. Clayborn Bigness as a part of collaborative care agreement.   Total time spent:30 Minutes Time spent includes review of chart, medications, test results, and follow up plan with the patient.      Dr Lavera Guise Internal medicine

## 2022-03-23 ENCOUNTER — Encounter: Payer: Self-pay | Admitting: Physician Assistant

## 2022-04-13 ENCOUNTER — Ambulatory Visit: Payer: Commercial Managed Care - PPO | Admitting: Physician Assistant

## 2022-07-05 IMAGING — CR DG ELBOW COMPLETE 3+V*L*
1 series · 4 of 4 positions shown · non-contrast
Comparison: None.

CLINICAL DATA: left elbow pain x 1 month

EXAM:
LEFT ELBOW - COMPLETE 3+ VIEW

[Series 1: dg elbow complete left (3+view) · 0.14mm/px · 4 of 4 slices shown]
[im 1/4]
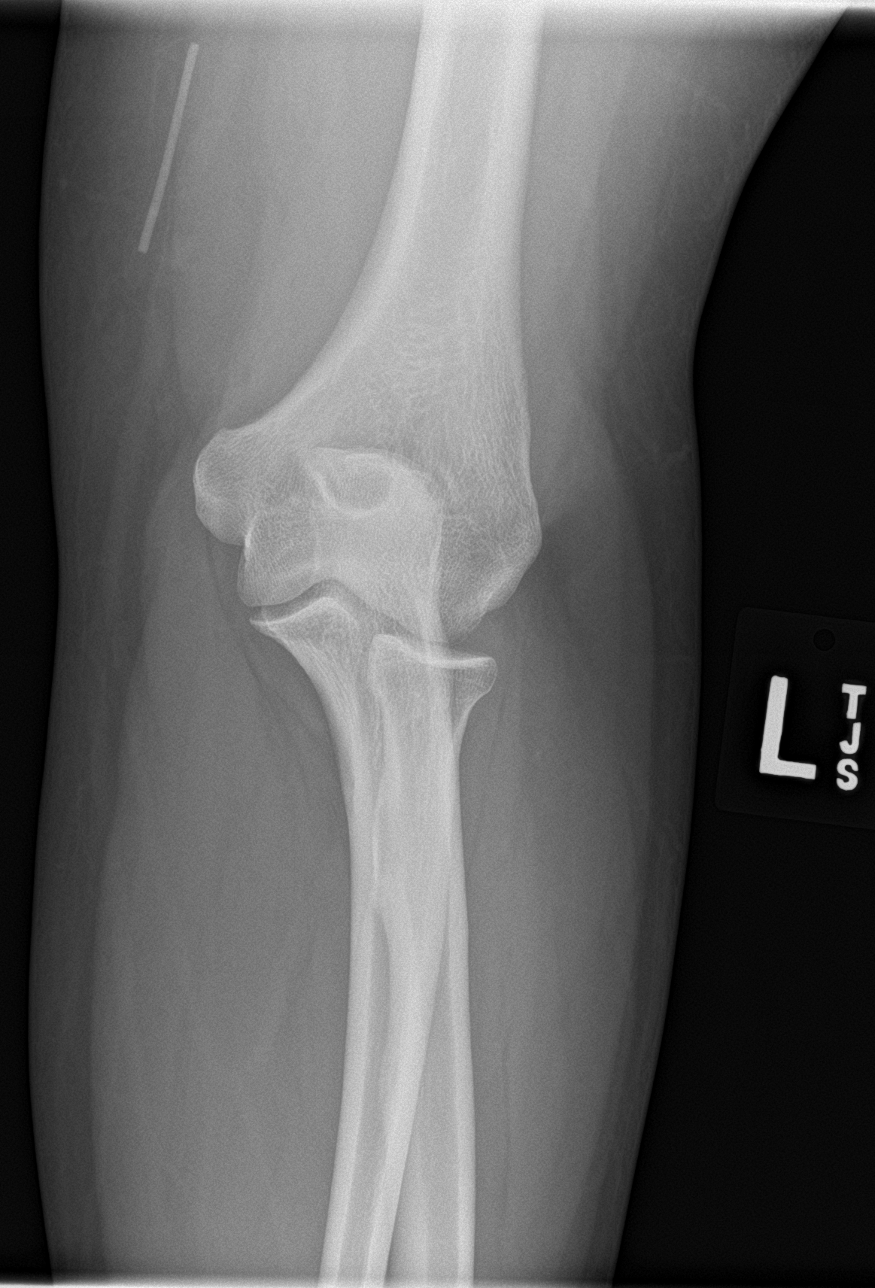
[im 2/4]
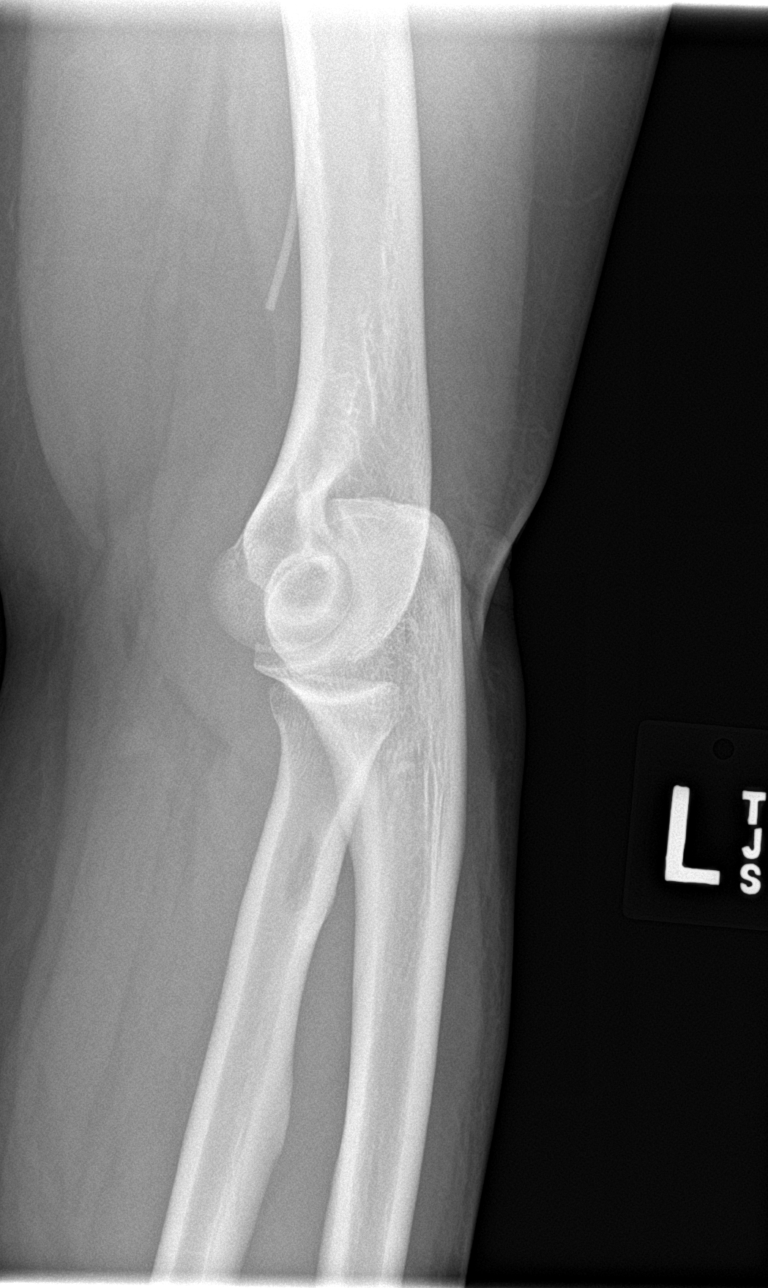
[im 3/4]
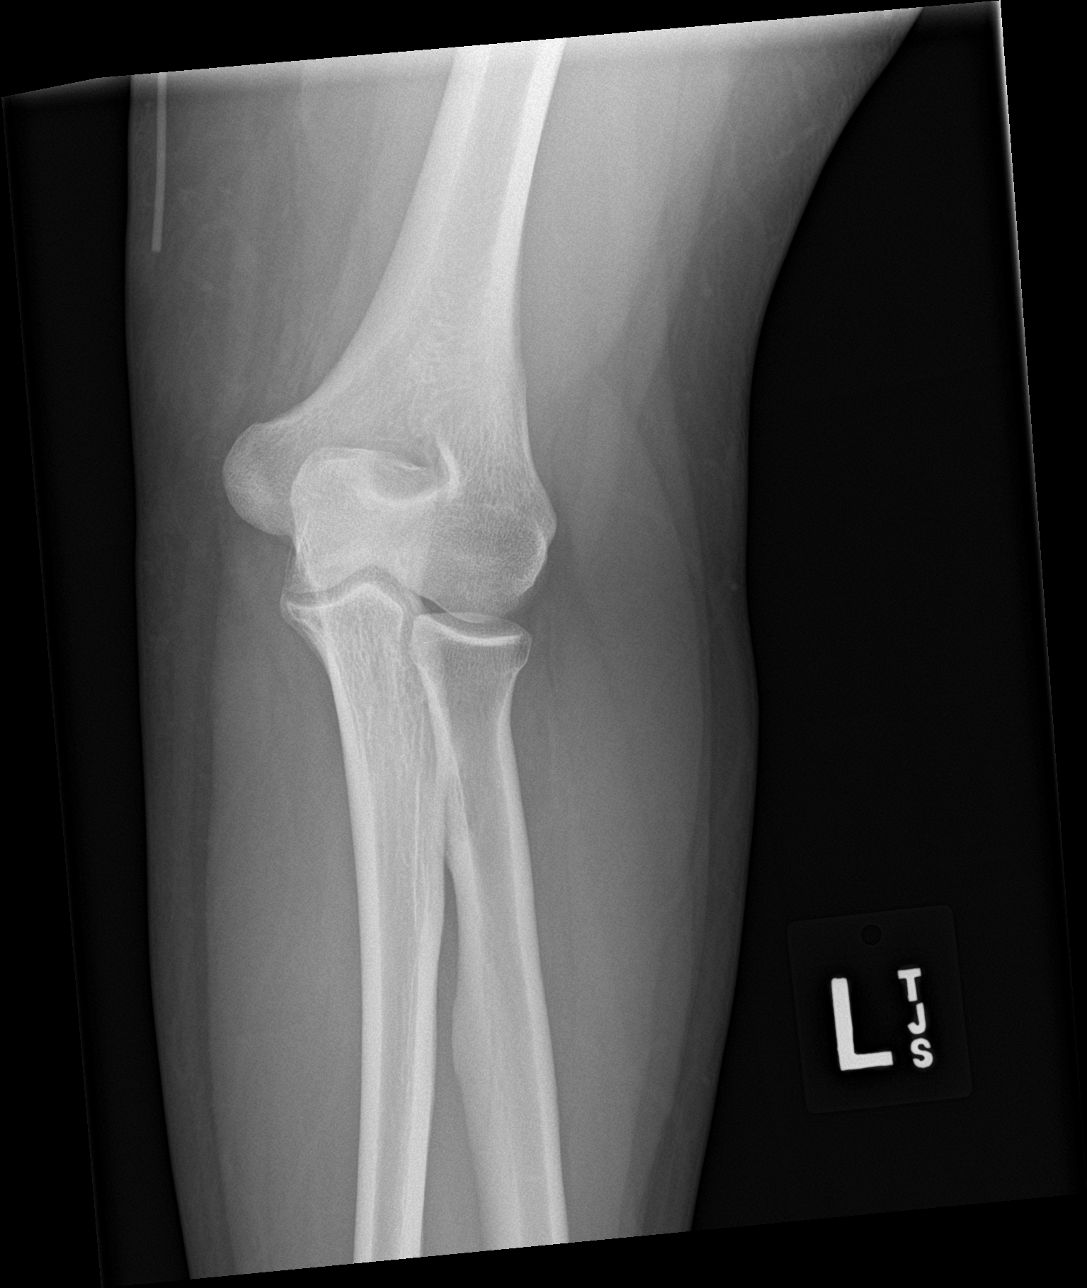
[im 4/4]
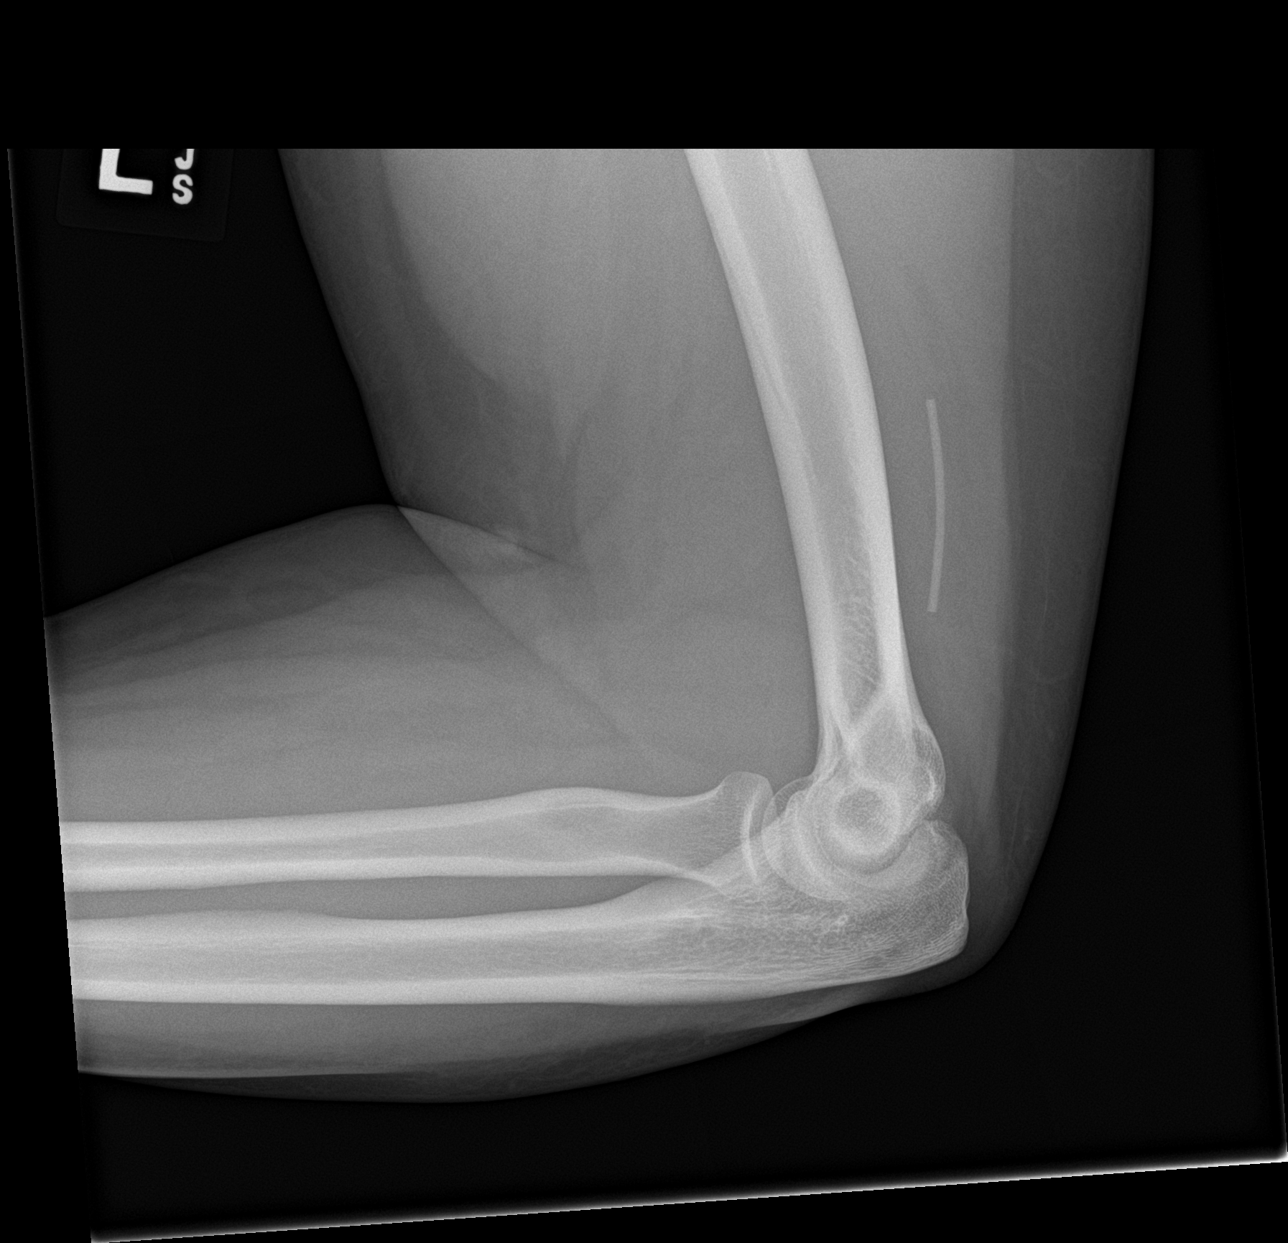

[4 of 4 positions shown; findings below may reference images not displayed]

FINDINGS: Normal alignment. No acute fracture. Normal mineralization.
Nexplanon implant present in the posteromedial soft tissues. No
joint effusion.
IMPRESSION: 1. No acute osseous abnormality.
2. Nexplanon implant present in the posteromedial soft tissues.

## 2022-11-07 ENCOUNTER — Telehealth: Payer: Self-pay | Admitting: Physician Assistant

## 2022-11-07 NOTE — Telephone Encounter (Signed)
01/16/22-01/16/23 MR uploaded to [email or web address]; [email or web address]

## 2023-03-22 ENCOUNTER — Encounter: Payer: Self-pay | Admitting: Physician Assistant

## 2023-03-22 ENCOUNTER — Ambulatory Visit (INDEPENDENT_AMBULATORY_CARE_PROVIDER_SITE_OTHER): Admitting: Physician Assistant

## 2023-03-22 VITALS — BP 140/70 | HR 94 | Temp 98.5°F | Resp 16 | Ht 68.0 in | Wt 366.2 lb

## 2023-03-22 DIAGNOSIS — R768 Other specified abnormal immunological findings in serum: Secondary | ICD-10-CM | POA: Diagnosis not present

## 2023-03-22 DIAGNOSIS — R200 Anesthesia of skin: Secondary | ICD-10-CM | POA: Diagnosis not present

## 2023-03-22 DIAGNOSIS — E538 Deficiency of other specified B group vitamins: Secondary | ICD-10-CM

## 2023-03-22 DIAGNOSIS — E559 Vitamin D deficiency, unspecified: Secondary | ICD-10-CM

## 2023-03-22 DIAGNOSIS — R062 Wheezing: Secondary | ICD-10-CM | POA: Diagnosis not present

## 2023-03-22 DIAGNOSIS — E782 Mixed hyperlipidemia: Secondary | ICD-10-CM

## 2023-03-22 DIAGNOSIS — M130 Polyarthritis, unspecified: Secondary | ICD-10-CM

## 2023-03-22 DIAGNOSIS — E1169 Type 2 diabetes mellitus with other specified complication: Secondary | ICD-10-CM

## 2023-03-22 DIAGNOSIS — R252 Cramp and spasm: Secondary | ICD-10-CM | POA: Diagnosis not present

## 2023-03-22 DIAGNOSIS — R202 Paresthesia of skin: Secondary | ICD-10-CM

## 2023-03-22 DIAGNOSIS — I10 Essential (primary) hypertension: Secondary | ICD-10-CM

## 2023-03-22 DIAGNOSIS — M25511 Pain in right shoulder: Secondary | ICD-10-CM

## 2023-03-22 DIAGNOSIS — R5383 Other fatigue: Secondary | ICD-10-CM

## 2023-03-22 DIAGNOSIS — Z1329 Encounter for screening for other suspected endocrine disorder: Secondary | ICD-10-CM

## 2023-03-22 DIAGNOSIS — K219 Gastro-esophageal reflux disease without esophagitis: Secondary | ICD-10-CM

## 2023-03-22 MED ORDER — OMEPRAZOLE 40 MG PO CPDR: 40.0000 mg | DELAYED_RELEASE_CAPSULE | Freq: Every day | ORAL | 3 refills | Status: DC

## 2023-03-22 MED ORDER — ALBUTEROL SULFATE HFA 108 (90 BASE) MCG/ACT IN AERS: 2.0000 | INHALATION_SPRAY | RESPIRATORY_TRACT | 1 refills | Status: DC | PRN

## 2023-03-22 MED ORDER — METHOCARBAMOL 500 MG PO TABS: 500.0000 mg | ORAL_TABLET | Freq: Every day | ORAL | 3 refills | Status: DC

## 2023-03-22 NOTE — Progress Notes (Signed)
 [facility] [address] 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 She is taking another BP pill on the other days but is not sure which one and will check when she gets home. Had previously discussed keeping a better routine with medication such as taking lisinopril or amlodipine daily and holding off on diuretic unless needed. -describes knots in legs now, not just in back. Ran out of muscle relaxer awhile ago and this used to help. Has to massage legs to help. Does have significant joint pain and did see rheumatology once for positive ANA for possible CTD, but unfortunately had some insurance changes and never had any follow up. Will recheck labs and refer to rheumatology -Will be getting nexplanon removed soon -trying to work on exercise and diet -Asthma flaring, needs albuterol -will check all labs  Current Medication: Outpatient Encounter Medications as of 03/22/2023  Medication Sig   acetaminophen (TYLENOL) 500 MG tablet Take 1,000 mg by mouth every 6 (six) hours as needed.   etonogestrel (NEXPLANON) 68 MG IMPL implant 1 each by Subdermal route once.   triamterene-hydrochlorothiazide (DYAZIDE) 37.5-25 MG capsule Take 1 each (1 capsule total) by mouth daily.   [DISCONTINUED] albuterol (VENTOLIN HFA) 108 (90 Base) MCG/ACT inhaler Inhale 2 puffs into the lungs every 4 (four) hours as needed for wheezing or shortness of breath.   [DISCONTINUED] amLODipine (NORVASC)  10 MG tablet Take 1 tablet (10 mg total) by mouth daily.   [DISCONTINUED] chlorpheniramine-HYDROcodone (TUSSIONEX) 10-8 MG/5ML Take 5 mLs by mouth every 12 (twelve) hours as needed for cough.   [DISCONTINUED] doxycycline (VIBRA-TABS) 100 MG tablet Take 1 tablet (100 mg total) by mouth 2 (two) times daily.   [DISCONTINUED] FLUoxetine (PROZAC) 20 MG capsule Take 20 mg by mouth daily.   [DISCONTINUED] hydroxychloroquine (PLAQUENIL) 200 MG tablet Take 2 tablets (400 mg total) by mouth daily.   [DISCONTINUED] lisinopril (ZESTRIL) 5 MG tablet Take 1 tablet (5 mg total) by mouth daily.   [DISCONTINUED] methocarbamol (ROBAXIN) 500 MG tablet Take 1 tablet (500 mg total) by mouth at bedtime.   [DISCONTINUED] omeprazole (PRILOSEC) 40 MG capsule Take 1 capsule (40 mg total) by mouth daily.   [DISCONTINUED] predniSONE (DELTASONE) 10 MG tablet Use per dose pack   [DISCONTINUED] tirzepatide (MOUNJARO) 2.5 MG/0.5ML Pen Inject 2.5 mg into the skin once a week.   [DISCONTINUED] traZODone (DESYREL) 50 MG tablet Take 50 mg by mouth at bedtime.   albuterol (VENTOLIN HFA) 108 (90 Base) MCG/ACT inhaler Inhale 2 puffs into the lungs every 4 (four) hours as needed for wheezing or shortness of breath.   lisinopril (ZESTRIL) 5 MG tablet Take 1 tablet (5 mg total) by mouth daily.   methocarbamol (ROBAXIN) 500 MG tablet Take 1 tablet (500 mg total) by mouth at bedtime.   omeprazole (PRILOSEC) 40 MG capsule Take 1 capsule (40 mg total) by mouth daily.   No facility-administered encounter medications on file as of 03/22/2023.  Surgical History: Past Surgical History:  Procedure Laterality Date   CYSTOSCOPY W/ URETERAL STENT PLACEMENT Bilateral 04/02/2019   Procedure: CYSTOSCOPY WITH RETROGRADE PYELOGRAM/URETERAL STENT PLACEMENT;  Surgeon: Vanna Scotland, MD;  Location: [facility];  Service: Urology;  Laterality: Bilateral;   CYSTOSCOPY W/ URETERAL STENT REMOVAL Bilateral 04/02/2019   Procedure: CYSTOSCOPY WITH STENT REMOVAL;   Surgeon: Ward, Elenora Fender, MD;  Location: [facility];  Service: Gynecology;  Laterality: Bilateral;   head injury     requiring closure   LAPAROSCOPY N/A 03/07/2019   Procedure: LAPAROSCOPY DIAGNOSTIC, lysis of adhesions;  Surgeon: Ward, Elenora Fender, MD;  Location: [facility];  Service: Gynecology;  Laterality: N/A;   ROBOTIC ASSISTED LAPAROSCOPIC OVARIAN CYSTECTOMY Right 04/02/2019   Procedure: XI ROBOTIC ASSISTED LAPAROSCOPIC OVARIAN CYSTECTOMY;  Surgeon: Ward, Elenora Fender, MD;  Location: [facility];  Service: Gynecology;  Laterality: Right;   ROBOTIC ASSISTED TOTAL HYSTERECTOMY WITH BILATERAL SALPINGO OOPHERECTOMY Bilateral 04/02/2019   Procedure: XI ROBOTIC ASSISTED TOTAL HYSTERECTOMY WITH LEFT OOPHORECTOMY, BILATERAL SALPINGECTOMY, OVARIOLYSIS, ENTEROLYSIS,;  Surgeon: Ward, Elenora Fender, MD;  Location: [facility];  Service: Gynecology;  Laterality: Bilateral;    Medical History: Past Medical History:  Diagnosis Date   Arthritis    Bipolar 1 disorder (HCC)    younger, no meds now   Diabetes mellitus    Endometriosis    GERD (gastroesophageal reflux disease)    Headache    Hypertension     Family History: Family History  Problem Relation Age of Onset   Hypertension Mother    Hypertension Father    Heart disease Father    Bipolar disorder Father     Social History   Socioeconomic History   Marital status: Married    Spouse name: Not on file   Number of children: Not on file   Years of education: Not on file   Highest education level: Not on file  Occupational History   Not on file  Tobacco Use   Smoking status: Some Days    Current packs/day: 0.10    Average packs/day: 0.1 packs/day for 12.0 years (1.2 ttl pk-yrs)    Types: E-cigarettes, Cigarettes   Smokeless tobacco: Never  Vaping Use   Vaping status: Every Day  Substance and Sexual Activity   Alcohol use: Yes    Comment: rarely   Drug use: No   Sexual activity: Never  Other Topics Concern   Not on file  Social History  Narrative   Not on file   Social Drivers of Health   Financial Resource Strain: Not on file  Food Insecurity: Not on file  Transportation Needs: Not on file  Physical Activity: Not on file  Stress: Not on file  Social Connections: Not on file  Intimate Partner Violence: Not on file      Review of Systems  Constitutional:  Positive for fatigue. Negative for chills and unexpected weight change.  HENT:  Negative for congestion, postnasal drip, rhinorrhea, sneezing and sore throat.   Eyes:  Negative for redness.  Respiratory:  Negative for cough, chest tightness and shortness of breath.   Cardiovascular:  Negative for chest pain and palpitations.  Gastrointestinal:  Negative for abdominal pain, constipation, diarrhea, nausea and vomiting.  Genitourinary:  Negative for dysuria and frequency.  Musculoskeletal:  Positive for arthralgias, back pain, joint swelling and myalgias. Negative for neck pain.  Skin:  Negative for rash.  Neurological: Negative.  Negative for tremors and numbness.  Hematological:  Negative for adenopathy. Does not bruise/bleed easily.  Psychiatric/Behavioral:  Positive for sleep disturbance. Negative for behavioral problems (Depression) and suicidal ideas. The patient is nervous/anxious.     Vital Signs: BP (!) 140/70   Pulse 94   Temp 98.5 F (36.9 C)   Resp 16   Ht 5\' 8"  (1.727 m)   Wt (!) 366 lb 3.2 oz (166.1 kg)   LMP 02/27/2019   SpO2 98%   BMI 55.68 kg/m    Physical Exam Vitals and nursing note reviewed.  Constitutional:      General: She is not in acute distress.    Appearance: Normal appearance. She is well-developed. She is obese. She is not diaphoretic.  HENT:     Head: Normocephalic and atraumatic.  Neck:     Thyroid: No thyromegaly.     Vascular: No JVD.     Trachea: No tracheal deviation.  Cardiovascular:     Rate and Rhythm: Normal rate and regular rhythm.     Heart sounds: Normal heart sounds. No murmur heard.    No friction  rub. No gallop.  Pulmonary:     Effort: Pulmonary effort is normal. No respiratory distress.     Breath sounds: No wheezing or rales.  Chest:     Chest wall: No tenderness.  Skin:    General: Skin is warm and dry.  Neurological:     Mental Status: She is alert and oriented to person, place, and time.  Psychiatric:        Behavior: Behavior normal.        Thought Content: Thought content normal.        Judgment: Judgment normal.        Assessment/Plan: 1. Essential (primary) hypertension (Primary) Pt sent follow up message to confirm medication and is taking lisinopril 5mg  M-Th. Will have her take this everyday and hold off on triamterene-hydrochlorothiazide due to inability to take on work days. IF bp high then will double to 10mg . May need to add back amlodipine in future if needed - lisinopril (ZESTRIL) 5 MG tablet; Take 1 tablet (5 mg total) by mouth daily.  Dispense: 90 tablet; Refill: 3  2. Type 2 diabetes mellitus with other specified complication, without long-term current use of insulin (HCC)  - POCT HgB A1C is 6.0 which is slightly improved from 6.1 last year  3. Numbness and tingling in right hand - methocarbamol (ROBAXIN) 500 MG tablet; Take 1 tablet (500 mg total) by mouth at bedtime.  Dispense: 30 tablet; Refill: 3  4. Wheezing - albuterol (VENTOLIN HFA) 108 (90 Base) MCG/ACT inhaler; Inhale 2 puffs into the lungs every 4 (four) hours as needed for wheezing or shortness of breath.  Dispense: 8 g; Refill: 1  5. Gastroesophageal reflux disease without esophagitis - omeprazole (PRILOSEC) 40 MG capsule; Take 1 capsule (40 mg total) by mouth daily.  Dispense: 30 capsule; Refill: 3  6. Polyarthropathy Will recheck labs and refer back to rheumatology - ANA w/Reflex if Positive - Rheumatoid Factor  7. Positive ANA (antinuclear antibody) Will refer to rheumatology - ANA w/Reflex if Positive  8. B12 deficiency - B12 and Folate Panel  9. Vitamin D deficiency -  VITAMIN D 25 Hydroxy (Vit-D Deficiency, Fractures)  10. Muscle cramping - Magnesium  11. Mixed hyperlipidemia - Lipid Panel With LDL/HDL Ratio  12. Thyroid disorder screen - TSH + free T4  13. Other fatigue - CBC w/Diff/Platelet - Comprehensive metabolic panel - TSH + free T4 - Lipid Panel With LDL/HDL Ratio - Magnesium - ANA w/Reflex if Positive -  Rheumatoid Factor - VITAMIN D 25 Hydroxy (Vit-D Deficiency, Fractures) - B12 and Folate Panel - Fe+TIBC+Fer   General Counseling: Dyamon verbalizes understanding of the findings of todays visit and agrees with plan of treatment. I have discussed any further diagnostic evaluation that may be needed or ordered today. We also reviewed her medications today. she has been encouraged to call the office with any questions or concerns that should arise related to todays visit.    Orders Placed This Encounter  Procedures   CBC w/Diff/Platelet   Comprehensive metabolic panel   TSH + free T4   Lipid Panel With LDL/HDL Ratio   Magnesium   ANA w/Reflex if Positive   Rheumatoid Factor   VITAMIN D 25 Hydroxy (Vit-D Deficiency, Fractures)   B12 and Folate Panel   Fe+TIBC+Fer   Ambulatory referral to Rheumatology   POCT HgB A1C    Meds ordered this encounter  Medications   methocarbamol (ROBAXIN) 500 MG tablet    Sig: Take 1 tablet (500 mg total) by mouth at bedtime.    Dispense:  30 tablet    Refill:  3   albuterol (VENTOLIN HFA) 108 (90 Base) MCG/ACT inhaler    Sig: Inhale 2 puffs into the lungs every 4 (four) hours as needed for wheezing or shortness of breath.    Dispense:  8 g    Refill:  1   omeprazole (PRILOSEC) 40 MG capsule    Sig: Take 1 capsule (40 mg total) by mouth daily.    Dispense:  30 capsule    Refill:  3   lisinopril (ZESTRIL) 5 MG tablet    Sig: Take 1 tablet (5 mg total) by mouth daily.    Dispense:  90 tablet    Refill:  3    This patient was seen by Lynn Ito, PA-C in collaboration with Dr.  Beverely Risen as a part of collaborative care agreement.   Total time spent:35 Minutes Time spent includes review of chart, medications, test results, and follow up plan with the patient.      Dr Lyndon Code Internal medicine

## 2023-03-22 NOTE — Progress Notes (Signed)
 ,

## 2023-03-23 ENCOUNTER — Other Ambulatory Visit
Admission: RE | Admit: 2023-03-23 | Discharge: 2023-03-23 | Disposition: A | Discharge status: Home / Self Care | Attending: Physician Assistant | Admitting: Physician Assistant

## 2023-03-23 DIAGNOSIS — R5383 Other fatigue: Secondary | ICD-10-CM | POA: Diagnosis not present

## 2023-03-23 DIAGNOSIS — Z1329 Encounter for screening for other suspected endocrine disorder: Secondary | ICD-10-CM | POA: Diagnosis not present

## 2023-03-23 DIAGNOSIS — E559 Vitamin D deficiency, unspecified: Secondary | ICD-10-CM | POA: Insufficient documentation

## 2023-03-23 DIAGNOSIS — E782 Mixed hyperlipidemia: Secondary | ICD-10-CM | POA: Diagnosis not present

## 2023-03-23 DIAGNOSIS — M1310 Monoarthritis, not elsewhere classified, unspecified site: Secondary | ICD-10-CM | POA: Diagnosis not present

## 2023-03-23 DIAGNOSIS — R768 Other specified abnormal immunological findings in serum: Secondary | ICD-10-CM | POA: Insufficient documentation

## 2023-03-23 DIAGNOSIS — E538 Deficiency of other specified B group vitamins: Secondary | ICD-10-CM | POA: Insufficient documentation

## 2023-03-23 DIAGNOSIS — R252 Cramp and spasm: Secondary | ICD-10-CM | POA: Diagnosis not present

## 2023-03-23 LAB — LIPID PANEL
Cholesterol: 128 mg/dL (ref 0–200)
HDL: 47 mg/dL (ref >40)
LDL Cholesterol: 67 mg/dL (ref 0–99)
Total CHOL/HDL Ratio: 2.7 ratio
Triglycerides: 70 mg/dL (ref <150)
VLDL: 14 mg/dL (ref 0–40)

## 2023-03-23 LAB — CBC WITH DIFFERENTIAL/PLATELET
Abs Immature Granulocytes: 0.01 10*3/uL (ref 0.00–0.07)
Basophils Absolute: 0 10*3/uL (ref 0.0–0.1)
Basophils Relative: 0 %
Eosinophils Absolute: 0.1 10*3/uL (ref 0.0–0.5)
Eosinophils Relative: 1 %
HCT: 35.9 % — ABNORMAL LOW (ref 36.0–46.0)
Hemoglobin: 11.4 g/dL — ABNORMAL LOW (ref 12.0–15.0)
Immature Granulocytes: 0 %
Lymphocytes Relative: 38 %
Lymphs Abs: 2.5 10*3/uL (ref 0.7–4.0)
MCH: 25 pg — ABNORMAL LOW (ref 26.0–34.0)
MCHC: 31.8 g/dL (ref 30.0–36.0)
MCV: 78.7 fL — ABNORMAL LOW (ref 80.0–100.0)
Monocytes Absolute: 0.4 10*3/uL (ref 0.1–1.0)
Monocytes Relative: 6 %
Neutro Abs: 3.6 10*3/uL (ref 1.7–7.7)
Neutrophils Relative %: 55 %
Platelets: 347 10*3/uL (ref 150–400)
RBC: 4.56 MIL/uL (ref 3.87–5.11)
RDW: 13.8 % (ref 11.5–15.5)
WBC: 6.6 10*3/uL (ref 4.0–10.5)
nRBC: 0 % (ref 0.0–0.2)

## 2023-03-23 LAB — COMPREHENSIVE METABOLIC PANEL
ALT: 20 U/L (ref 0–44)
AST: 18 U/L (ref 15–41)
Albumin: 3.3 g/dL — ABNORMAL LOW (ref 3.5–5.0)
Alkaline Phosphatase: 78 U/L (ref 38–126)
Anion gap: 9 (ref 5–15)
BUN: 15 mg/dL (ref 6–20)
CO2: 23 mmol/L (ref 22–32)
Calcium: 9 mg/dL (ref 8.9–10.3)
Chloride: 106 mmol/L (ref 98–111)
Creatinine, Ser: 0.66 mg/dL (ref 0.44–1.00)
GFR, Estimated: >60 mL/min (ref >60)
Glucose, Bld: 122 mg/dL — ABNORMAL HIGH (ref 70–99)
Potassium: 3.5 mmol/L (ref 3.5–5.1)
Sodium: 138 mmol/L (ref 135–145)
Total Bilirubin: 0.5 mg/dL (ref 0.0–1.2)
Total Protein: 7.6 g/dL (ref 6.5–8.1)

## 2023-03-23 LAB — IRON AND TIBC
Iron: 33 ug/dL (ref 28–170)
Saturation Ratios: 8 % — ABNORMAL LOW (ref 10.4–31.8)
TIBC: 406 ug/dL (ref 250–450)
UIBC: 373 ug/dL

## 2023-03-23 LAB — FOLATE: Folate: 19.9 ng/mL (ref >5.9)

## 2023-03-23 LAB — T4, FREE: Free T4: 0.77 ng/dL (ref 0.61–1.12)

## 2023-03-23 LAB — MAGNESIUM: Magnesium: 1.8 mg/dL (ref 1.7–2.4)

## 2023-03-23 LAB — TSH: TSH: 1.602 u[IU]/mL (ref 0.350–4.500)

## 2023-03-23 LAB — VITAMIN B12: Vitamin B-12: 513 pg/mL (ref 180–914)

## 2023-03-23 LAB — VITAMIN D 25 HYDROXY (VIT D DEFICIENCY, FRACTURES): Vit D, 25-Hydroxy: 25.01 ng/mL — ABNORMAL LOW (ref 30–100)

## 2023-03-23 LAB — FERRITIN: Ferritin: 35 ng/mL (ref 11–307)

## 2023-03-24 LAB — ENA+DNA/DS+ANTICH+CENTRO+JO...
Anti JO-1: <0.2 AI (ref 0.0–0.9)
Centromere Ab Screen: <0.2 AI (ref 0.0–0.9)
Chromatin Ab SerPl-aCnc: <0.2 AI (ref 0.0–0.9)
ENA SM Ab Ser-aCnc: <0.2 AI (ref 0.0–0.9)
Ribonucleic Protein: 1.7 AI — ABNORMAL HIGH (ref 0.0–0.9)
SSA (Ro) (ENA) Antibody, IgG: 0.8 AI (ref 0.0–0.9)
SSB (La) (ENA) Antibody, IgG: <0.2 AI (ref 0.0–0.9)
Scleroderma (Scl-70) (ENA) Antibody, IgG: <0.2 AI (ref 0.0–0.9)
ds DNA Ab: <1 [IU]/mL (ref 0–9)

## 2023-03-24 LAB — ANA W/REFLEX IF POSITIVE: Anti Nuclear Antibody (ANA): POSITIVE — AB

## 2023-03-25 LAB — RHEUMATOID FACTOR: Rheumatoid fact SerPl-aCnc: 10.4 [IU]/mL (ref <14.0)

## 2023-03-26 MED ORDER — LISINOPRIL 5 MG PO TABS: 5.0000 mg | ORAL_TABLET | Freq: Every day | ORAL | 3 refills | Status: DC

## 2023-03-27 ENCOUNTER — Emergency Department
Admission: EM | Admit: 2023-03-27 | Discharge: 2023-03-27 | Disposition: A | Discharge status: Home / Self Care | Attending: Emergency Medicine | Admitting: Emergency Medicine

## 2023-03-27 ENCOUNTER — Telehealth: Payer: Self-pay

## 2023-03-27 ENCOUNTER — Encounter: Payer: Self-pay | Admitting: Emergency Medicine

## 2023-03-27 ENCOUNTER — Emergency Department

## 2023-03-27 ENCOUNTER — Other Ambulatory Visit: Payer: Self-pay

## 2023-03-27 DIAGNOSIS — M25561 Pain in right knee: Secondary | ICD-10-CM | POA: Diagnosis not present

## 2023-03-27 DIAGNOSIS — M25511 Pain in right shoulder: Secondary | ICD-10-CM

## 2023-03-27 MED ORDER — NAPROXEN 500 MG PO TABS
500.0000 mg | ORAL_TABLET | Freq: Two times a day (BID) | ORAL | 0 refills | Status: AC
Start: 2023-03-27 — End: 2023-04-11

## 2023-03-27 NOTE — Discharge Instructions (Signed)
 Your exam and x-rays are normal and reassuring at this time.  No signs of a serious fracture or dislocation, based on activation.  Take the prescription Naprosyn as directed and apply ice to help reduce pain and swelling.  Wear the Ace bandage as needed for support.  Follow-up with [employer] for re-evaluation and return-to-work clearance.

## 2023-03-27 NOTE — Telephone Encounter (Signed)
 Spoke with patient regarding lab results.
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
## 2023-03-28 LAB — POCT GLYCOSYLATED HEMOGLOBIN (HGB A1C): Hemoglobin A1C: 6 % — AB (ref 4.0–5.6)

## 2023-04-04 ENCOUNTER — Telehealth: Payer: Self-pay | Admitting: Physician Assistant

## 2023-04-04 NOTE — Telephone Encounter (Signed)
 Rheumatology referral sent via [facility] to [facility].  Lvm notifying patient. Gave telephone # [phone]

## 2023-04-19 ENCOUNTER — Ambulatory Visit (INDEPENDENT_AMBULATORY_CARE_PROVIDER_SITE_OTHER): Admitting: Physician Assistant

## 2023-04-19 ENCOUNTER — Encounter: Payer: Self-pay | Admitting: Physician Assistant

## 2023-04-19 VITALS — BP 154/96 | HR 100 | Temp 98.2°F | Resp 16 | Ht 68.0 in | Wt 369.0 lb

## 2023-04-19 DIAGNOSIS — I10 Essential (primary) hypertension: Secondary | ICD-10-CM

## 2023-04-19 DIAGNOSIS — M25561 Pain in right knee: Secondary | ICD-10-CM

## 2023-04-19 DIAGNOSIS — E1169 Type 2 diabetes mellitus with other specified complication: Secondary | ICD-10-CM

## 2023-04-19 MED ORDER — LISINOPRIL 10 MG PO TABS
10.0000 mg | ORAL_TABLET | Freq: Every day | ORAL | 3 refills | Status: DC
Start: 2023-04-19 — End: 2023-05-21
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
## 2023-04-20 LAB — MICROALBUMIN / CREATININE URINE RATIO
Creatinine, Urine: 204.3 mg/dL
Microalb/Creat Ratio: 6 mg/g{creat} (ref 0–29)
Microalbumin, Urine: 11.8 ug/mL

## 2023-04-23 ENCOUNTER — Telehealth: Payer: Self-pay | Admitting: Nurse Practitioner

## 2023-04-23 NOTE — Telephone Encounter (Signed)
 Per [facility] w/ [facility], referral has been closed due patient cancelled appointment -Tara Estes

## 2023-05-21 ENCOUNTER — Ambulatory Visit (INDEPENDENT_AMBULATORY_CARE_PROVIDER_SITE_OTHER): Admitting: Physician Assistant

## 2023-05-21 ENCOUNTER — Encounter: Payer: Self-pay | Admitting: Physician Assistant

## 2023-05-21 VITALS — BP 140/90 | HR 98 | Temp 97.8°F | Resp 16 | Ht 68.0 in | Wt 374.0 lb

## 2023-05-21 DIAGNOSIS — R252 Cramp and spasm: Secondary | ICD-10-CM

## 2023-05-21 DIAGNOSIS — I10 Essential (primary) hypertension: Secondary | ICD-10-CM

## 2023-05-21 DIAGNOSIS — R200 Anesthesia of skin: Secondary | ICD-10-CM

## 2023-05-21 MED ORDER — LISINOPRIL 20 MG PO TABS: 20.0000 mg | ORAL_TABLET | Freq: Every day | ORAL | 3 refills | Status: DC

## 2023-05-21 MED ORDER — METHOCARBAMOL 500 MG PO TABS
500.0000 mg | ORAL_TABLET | Freq: Every day | ORAL | 3 refills | Status: DC
Start: 2023-05-21 — End: 2023-06-18
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
## 2023-06-18 ENCOUNTER — Encounter: Payer: Self-pay | Admitting: Physician Assistant

## 2023-06-18 ENCOUNTER — Other Ambulatory Visit: Payer: Self-pay | Admitting: Physician Assistant

## 2023-06-18 DIAGNOSIS — R252 Cramp and spasm: Secondary | ICD-10-CM

## 2023-06-18 MED ORDER — CYCLOBENZAPRINE HCL 10 MG PO TABS: 10.0000 mg | ORAL_TABLET | Freq: Every day | ORAL | 1 refills | Status: DC

## 2023-06-27 ENCOUNTER — Ambulatory Visit (INDEPENDENT_AMBULATORY_CARE_PROVIDER_SITE_OTHER): Admitting: Nurse Practitioner

## 2023-06-27 ENCOUNTER — Encounter: Payer: Self-pay | Admitting: Nurse Practitioner

## 2023-06-27 VITALS — BP 135/88 | HR 100 | Temp 97.2°F | Resp 16 | Ht 68.0 in | Wt 374.0 lb

## 2023-06-27 DIAGNOSIS — M549 Dorsalgia, unspecified: Secondary | ICD-10-CM | POA: Diagnosis not present

## 2023-06-27 DIAGNOSIS — M25561 Pain in right knee: Secondary | ICD-10-CM

## 2023-06-27 DIAGNOSIS — G8929 Other chronic pain: Secondary | ICD-10-CM

## 2023-06-27 DIAGNOSIS — R252 Cramp and spasm: Secondary | ICD-10-CM

## 2023-06-27 MED ORDER — CELECOXIB 200 MG PO CAPS: 200.0000 mg | ORAL_CAPSULE | Freq: Two times a day (BID) | ORAL | 3 refills | Status: DC

## 2023-06-27 MED ORDER — METHYLPREDNISOLONE ACETATE 80 MG/ML IJ SUSP
80.0000 mg | Freq: Once | INTRAMUSCULAR | Status: AC
  Administered 2023-06-27: 40 mg via INTRAMUSCULAR

## 2023-06-27 MED ORDER — PREDNISONE 10 MG (21) PO TBPK: ORAL_TABLET | ORAL | 0 refills | Status: DC

## 2023-06-27 NOTE — Progress Notes (Signed)
 [facility] [address] [place], [place] [address]  Internal MEDICINE  Office Visit Note  Patient Name: Tara Estes  [id number]  [id number]  Date of Service: 06/27/2023  Chief Complaint  Patient presents with   Acute Visit    fall     HPI Tara Estes presents for an acute sick visit for recent fall and severe low back pain.  --fell 2 weeks ago, having severe back pain, mid back that radiates up and down spine, rated 10 out of 10 on pain scale.  -- seeing orthopedic for her right knee and right shoulder injury via worker's comp but they will not treat her back.  Severe pain when sitting for prolonged periods of time or standing for prolonged periods of time.       Current Medication:  Outpatient Encounter Medications as of 06/27/2023  Medication Sig   acetaminophen  (TYLENOL ) 500 MG tablet Take 1,000 mg by mouth every 6 (six) hours as needed.   albuterol  (VENTOLIN  HFA) 108 (90 Base) MCG/ACT inhaler Inhale 2 puffs into the lungs every 4 (four) hours as needed for wheezing or shortness of breath.   celecoxib  (CELEBREX ) 200 MG capsule Take 1 capsule (200 mg total) by mouth 2 (two) times daily.   cyclobenzaprine  (FLEXERIL ) 10 MG tablet Take 1 tablet (10 mg total) by mouth at bedtime. Take one tab po qhs for back spasm prn only   etonogestrel (NEXPLANON) 68 MG IMPL implant 1 each by Subdermal route once.   lisinopril  (ZESTRIL ) 20 MG tablet Take 1 tablet (20 mg total) by mouth daily.   omeprazole  (PRILOSEC) 40 MG capsule Take 1 capsule (40 mg total) by mouth daily.   predniSONE  (STERAPRED UNI-PAK 21 TAB) 10 MG (21) TBPK tablet Use as directed for 6 days   [DISCONTINUED] CELEBREX  100 MG capsule Take 100 mg by mouth 2 (two) times daily.   [EXPIRED] methylPREDNISolone  acetate (DEPO-MEDROL ) injection 80 mg    No facility-administered encounter medications on file as of 06/27/2023.      Medical History: Past Medical History:  Diagnosis Date   Arthritis    Bipolar 1  disorder (HCC)    younger, no meds now   Diabetes mellitus    Endometriosis    GERD (gastroesophageal reflux disease)    Headache    Hypertension      Vital Signs: BP 135/88   Pulse 100   Temp (!) 97.2 F (36.2 C)   Resp 16   Ht 5' 8 (1.727 m)   Wt (!) 374 lb (169.6 kg)   LMP 02/27/2019   SpO2 99%   BMI 56.87 kg/m    Review of Systems  Constitutional:  Positive for activity change and fatigue. Negative for chills and unexpected weight change.  HENT:  Negative for congestion, postnasal drip, rhinorrhea, sneezing and sore throat.   Eyes:  Negative for redness.  Respiratory:  Negative for cough, chest tightness and shortness of breath.   Cardiovascular:  Negative for chest pain and palpitations.  Gastrointestinal:  Negative for abdominal pain, constipation, diarrhea, nausea and vomiting.  Genitourinary:  Negative for dysuria and frequency.  Musculoskeletal:  Positive for arthralgias, back pain, gait problem and myalgias. Negative for neck pain.  Skin:  Negative for rash.  Neurological:  Negative for tremors and numbness.  Hematological:  Negative for adenopathy. Does not bruise/bleed easily.  Psychiatric/Behavioral:  Negative for behavioral problems (Depression) and suicidal ideas.     Physical Exam Vitals and nursing note reviewed.  Constitutional:  General: She is not in acute distress.    Appearance: Normal appearance. She is well-developed. She is obese. She is not diaphoretic.  HENT:     Head: Normocephalic and atraumatic.  Neck:     Thyroid : No thyromegaly.     Vascular: No JVD.     Trachea: No tracheal deviation.  Cardiovascular:     Rate and Rhythm: Normal rate and regular rhythm.     Heart sounds: Normal heart sounds. No murmur heard.    No friction rub. No gallop.  Pulmonary:     Effort: Pulmonary effort is normal. No respiratory distress.     Breath sounds: No wheezing or rales.  Chest:     Chest wall: No tenderness.  Skin:    General: Skin is  warm and dry.  Neurological:     Mental Status: She is alert and oriented to person, place, and time.  Psychiatric:        Behavior: Behavior normal.        Thought Content: Thought content normal.        Judgment: Judgment normal.       Assessment/Plan: 1. Chronic mid back pain (Primary) Depo-medrol  injection administered today, start prednisone  taper tomorrow, celebrex  dose increased to 200 mg twice daily. Follow up with Lauren PA-C next month.  - methylPREDNISolone  acetate (DEPO-MEDROL ) injection 80 mg - predniSONE  (STERAPRED UNI-PAK 21 TAB) 10 MG (21) TBPK tablet; Use as directed for 6 days  Dispense: 21 tablet; Refill: 0 - celecoxib  (CELEBREX ) 200 MG capsule; Take 1 capsule (200 mg total) by mouth 2 (two) times daily.  Dispense: 60 capsule; Refill: 3  2. Pain of right knee after injury Has upcoming follow up with orthopedic   3. Muscle cramping Recently switched to cyclobenzaprine , continue as prescribed. Follow up with lauren PA-C next month   General Counseling: [name] verbalizes understanding of the findings of todays visit and agrees with plan of treatment. I have discussed any further diagnostic evaluation that may be needed or ordered today. We also reviewed her medications today. she has been encouraged to call the office with any questions or concerns that should arise related to todays visit.    Counseling:    No orders of the defined types were placed in this encounter.   Meds ordered this encounter  Medications   methylPREDNISolone  acetate (DEPO-MEDROL ) injection 80 mg   predniSONE  (STERAPRED UNI-PAK 21 TAB) 10 MG (21) TBPK tablet    Sig: Use as directed for 6 days    Dispense:  21 tablet    Refill:  0   celecoxib  (CELEBREX ) 200 MG capsule    Sig: Take 1 capsule (200 mg total) by mouth 2 (two) times daily.    Dispense:  60 capsule    Refill:  3    Fill new script today, discontinue 100 mg dose.    Return for previously scheduled, F/U with [name] in july.  [place] Controlled Substance Database was reviewed by me for overdose risk score (ORS)  Time spent:30 Minutes Time spent with patient included reviewing progress notes, labs, imaging studies, and discussing plan for follow up.   This patient was seen by Laurence Pons, FNP-C in collaboration with Dr. Verneta Gone as a part of collaborative care agreement.  Christin Mccreedy R. Bobbi Burow, MSN, FNP-C Internal Medicine

## 2023-06-28 ENCOUNTER — Ambulatory Visit: Admitting: Physician Assistant

## 2023-07-23 ENCOUNTER — Encounter: Payer: Self-pay | Admitting: Physician Assistant

## 2023-07-23 ENCOUNTER — Ambulatory Visit (INDEPENDENT_AMBULATORY_CARE_PROVIDER_SITE_OTHER): Admitting: Physician Assistant

## 2023-07-23 VITALS — BP 158/95 | HR 99 | Temp 98.4°F | Resp 16 | Ht 68.0 in | Wt 377.0 lb

## 2023-07-23 DIAGNOSIS — I10 Essential (primary) hypertension: Secondary | ICD-10-CM | POA: Diagnosis not present

## 2023-07-23 DIAGNOSIS — Z6841 Body Mass Index (BMI) 40.0 and over, adult: Secondary | ICD-10-CM | POA: Diagnosis not present

## 2023-07-23 DIAGNOSIS — E1169 Type 2 diabetes mellitus with other specified complication: Secondary | ICD-10-CM

## 2023-07-23 DIAGNOSIS — R300 Dysuria: Secondary | ICD-10-CM | POA: Diagnosis not present

## 2023-07-23 DIAGNOSIS — N390 Urinary tract infection, site not specified: Secondary | ICD-10-CM

## 2023-07-23 LAB — POCT URINALYSIS DIPSTICK
Bilirubin, UA: NEGATIVE
Glucose, UA: NEGATIVE
Ketones, UA: POSITIVE
Leukocytes, UA: NEGATIVE
Nitrite, UA: NEGATIVE
Protein, UA: NEGATIVE
Spec Grav, UA: 1.01 (ref 1.010–1.025)
Urobilinogen, UA: 0.2 U/dL
pH, UA: 5 (ref 5.0–8.0)

## 2023-07-23 LAB — POCT GLYCOSYLATED HEMOGLOBIN (HGB A1C): Hemoglobin A1C: 6.7 % — AB (ref 4.0–5.6)

## 2023-07-23 MED ORDER — TIRZEPATIDE 2.5 MG/0.5ML ~~LOC~~ SOAJ: 2.5000 mg | SUBCUTANEOUS | 0 refills | Status: DC

## 2023-07-23 MED ORDER — AMLODIPINE BESYLATE 5 MG PO TABS: 5.0000 mg | ORAL_TABLET | Freq: Every day | ORAL | 1 refills | Status: DC

## 2023-07-23 NOTE — Progress Notes (Signed)
 [facility] [address] [place], [place] [address]  Internal MEDICINE  Office Visit Note  Patient Name: Tara Estes  [id number]  [id number]  Date of Service: 08/08/2023  Chief Complaint  Patient presents with   Follow-up   Diabetes   Gastroesophageal Reflux   Hypertension    HPI Pt is here for routine follow -BP fluctuates -Having some headaches and neck pain -workers comp only addressing leg and shoulder, but had a fall due to leg and back worse. -has tried dry needling, cupping -back in counseling due to work stress and issues with court from last workers comp case -more urinary frequency, some lower pelvic pain -may need FMLA filled out  Current Medication: Outpatient Encounter Medications as of 07/23/2023  Medication Sig   acetaminophen  (TYLENOL ) 500 MG tablet Take 1,000 mg by mouth every 6 (six) hours as needed.   albuterol  (VENTOLIN  HFA) 108 (90 Base) MCG/ACT inhaler Inhale 2 puffs into the lungs every 4 (four) hours as needed for wheezing or shortness of breath.   amLODipine  (NORVASC ) 5 MG tablet Take 1 tablet (5 mg total) by mouth daily.   celecoxib  (CELEBREX ) 200 MG capsule Take 1 capsule (200 mg total) by mouth 2 (two) times daily.   cyclobenzaprine  (FLEXERIL ) 10 MG tablet Take 1 tablet (10 mg total) by mouth at bedtime. Take one tab po qhs for back spasm prn only   etonogestrel (NEXPLANON) 68 MG IMPL implant 1 each by Subdermal route once.   lisinopril  (ZESTRIL ) 20 MG tablet Take 1 tablet (20 mg total) by mouth daily.   nitrofurantoin , macrocrystal-monohydrate, (MACROBID ) 100 MG capsule Take 1 cap twice per day for 10 days.   omeprazole  (PRILOSEC) 40 MG capsule Take 1 capsule (40 mg total) by mouth daily.   predniSONE  (STERAPRED UNI-PAK 21 TAB) 10 MG (21) TBPK tablet Use as directed for 6 days   tirzepatide  (MOUNJARO ) 2.5 MG/0.5ML Pen Inject 2.5 mg into the skin once a week.   No facility-administered encounter medications on file as of 07/23/2023.     Surgical History: Past Surgical History:  Procedure Laterality Date   CYSTOSCOPY W/ URETERAL STENT PLACEMENT Bilateral 04/02/2019   Procedure: CYSTOSCOPY WITH RETROGRADE PYELOGRAM/URETERAL STENT PLACEMENT;  Surgeon: Penne Knee, MD;  Location: [facility];  Service: Urology;  Laterality: Bilateral;   CYSTOSCOPY W/ URETERAL STENT REMOVAL Bilateral 04/02/2019   Procedure: CYSTOSCOPY WITH STENT REMOVAL;  Surgeon: Ward, Mitzie BROCKS, MD;  Location: [facility];  Service: Gynecology;  Laterality: Bilateral;   head injury     requiring closure   LAPAROSCOPY N/A 03/07/2019   Procedure: LAPAROSCOPY DIAGNOSTIC, lysis of adhesions;  Surgeon: Ward, Mitzie BROCKS, MD;  Location: [facility];  Service: Gynecology;  Laterality: N/A;   ROBOTIC ASSISTED LAPAROSCOPIC OVARIAN CYSTECTOMY Right 04/02/2019   Procedure: XI ROBOTIC ASSISTED LAPAROSCOPIC OVARIAN CYSTECTOMY;  Surgeon: Ward, Mitzie BROCKS, MD;  Location: [facility];  Service: Gynecology;  Laterality: Right;   ROBOTIC ASSISTED TOTAL HYSTERECTOMY WITH BILATERAL SALPINGO OOPHERECTOMY Bilateral 04/02/2019   Procedure: XI ROBOTIC ASSISTED TOTAL HYSTERECTOMY WITH LEFT OOPHORECTOMY, BILATERAL SALPINGECTOMY, OVARIOLYSIS, ENTEROLYSIS,;  Surgeon: Ward, Mitzie BROCKS, MD;  Location: [facility];  Service: Gynecology;  Laterality: Bilateral;    Medical History: Past Medical History:  Diagnosis Date   Arthritis    Bipolar 1 disorder (HCC)    younger, no meds now   Diabetes mellitus    Endometriosis    GERD (gastroesophageal reflux disease)    Headache    Hypertension     Family History: Family History  Problem Relation Age of Onset   Hypertension Mother    Hypertension Father    Heart disease Father    Bipolar disorder Father     Social History   Socioeconomic History   Marital status: Married    Spouse name: Not on file   Number of children: Not on file   Years of education: Not on file   Highest education level: Not on file  Occupational History   Not on file   Tobacco Use   Smoking status: Some Days    Current packs/day: 0.10    Average packs/day: 0.1 packs/day for 12.0 years (1.2 ttl pk-yrs)    Types: E-cigarettes, Cigarettes   Smokeless tobacco: Never  Vaping Use   Vaping status: Every Day  Substance and Sexual Activity   Alcohol use: Yes    Comment: rarely   Drug use: No   Sexual activity: Never  Other Topics Concern   Not on file  Social History Narrative   Not on file   Social Drivers of Health   Financial Resource Strain: Not on file  Food Insecurity: Not on file  Transportation Needs: Not on file  Physical Activity: Not on file  Stress: Not on file  Social Connections: Not on file  Intimate Partner Violence: Not on file      Review of Systems  Constitutional:  Positive for activity change and fatigue. Negative for chills and unexpected weight change.  HENT:  Negative for congestion, postnasal drip, rhinorrhea, sneezing and sore throat.   Eyes:  Negative for redness.  Respiratory:  Negative for cough, chest tightness and shortness of breath.   Cardiovascular:  Negative for chest pain and palpitations.  Gastrointestinal:  Negative for abdominal pain, constipation, diarrhea, nausea and vomiting.  Genitourinary:  Negative for dysuria and frequency.  Musculoskeletal:  Positive for arthralgias, back pain, gait problem, myalgias and neck pain.  Skin:  Negative for rash.  Neurological:  Positive for headaches. Negative for tremors and numbness.  Hematological:  Negative for adenopathy. Does not bruise/bleed easily.  Psychiatric/Behavioral:  Negative for behavioral problems (Depression) and suicidal ideas.     Vital Signs: BP (!) 158/95   Pulse 99   Temp 98.4 F (36.9 C)   Resp 16   Ht 5' 8 (1.727 m)   Wt (!) 377 lb (171 kg)   LMP 02/27/2019   SpO2 97%   BMI 57.32 kg/m    Physical Exam Vitals and nursing note reviewed.  Constitutional:      General: She is not in acute distress.    Appearance: Normal  appearance. She is well-developed. She is obese. She is not diaphoretic.  HENT:     Head: Normocephalic and atraumatic.  Neck:     Thyroid : No thyromegaly.     Vascular: No JVD.     Trachea: No tracheal deviation.  Cardiovascular:     Rate and Rhythm: Normal rate and regular rhythm.     Heart sounds: Normal heart sounds. No murmur heard.    No friction rub. No gallop.  Pulmonary:     Effort: Pulmonary effort is normal. No respiratory distress.     Breath sounds: No wheezing or rales.  Chest:     Chest wall: No tenderness.  Skin:    General: Skin is warm and dry.  Neurological:     Mental Status: She is alert and oriented to person, place, and time.  Psychiatric:        Behavior: Behavior normal.  Thought Content: Thought content normal.        Judgment: Judgment normal.        Assessment/Plan: 1. Type 2 diabetes mellitus with other specified complication, without long-term current use of insulin (HCC) (Primary) - POCT HgB A1C is 6.7 which is increased from 6.0 last check. In diabetic range, will try starting mounjaro  to help BG and wt loss - tirzepatide  (MOUNJARO ) 2.5 MG/0.5ML Pen; Inject 2.5 mg into the skin once a week.  Dispense: 2 mL; Refill: 0  2. Essential (primary) hypertension Will add amlodipine  and monitor closely - amLODipine  (NORVASC ) 5 MG tablet; Take 1 tablet (5 mg total) by mouth daily.  Dispense: 90 tablet; Refill: 1  3. Morbid obesity with BMI of 50.0-59.9, adult (HCC) - tirzepatide  (MOUNJARO ) 2.5 MG/0.5ML Pen; Inject 2.5 mg into the skin once a week.  Dispense: 2 mL; Refill: 0  4. Urinary tract infection without hematuria, site unspecified Will treat with macrobid  and adjust based on C/S - CULTURE, URINE COMPREHENSIVE  5. Dysuria - POCT Urinalysis Dipstick - nitrofurantoin , macrocrystal-monohydrate, (MACROBID ) 100 MG capsule; Take 1 cap twice per day for 10 days.  Dispense: 20 capsule; Refill: 0   General Counseling: Jamella verbalizes  understanding of the findings of todays visit and agrees with plan of treatment. I have discussed any further diagnostic evaluation that may be needed or ordered today. We also reviewed her medications today. she has been encouraged to call the office with any questions or concerns that should arise related to todays visit.    Orders Placed This Encounter  Procedures   CULTURE, URINE COMPREHENSIVE   POCT HgB A1C   POCT Urinalysis Dipstick    Meds ordered this encounter  Medications   tirzepatide  (MOUNJARO ) 2.5 MG/0.5ML Pen    Sig: Inject 2.5 mg into the skin once a week.    Dispense:  2 mL    Refill:  0   amLODipine  (NORVASC ) 5 MG tablet    Sig: Take 1 tablet (5 mg total) by mouth daily.    Dispense:  90 tablet    Refill:  1   nitrofurantoin , macrocrystal-monohydrate, (MACROBID ) 100 MG capsule    Sig: Take 1 cap twice per day for 10 days.    Dispense:  20 capsule    Refill:  0    This patient was seen by Tinnie Pro, PA-C in collaboration with Dr. Sigrid Bathe as a part of collaborative care agreement.   Total time spent:30 Minutes Time spent includes review of chart, medications, test results, and follow up plan with the patient.      Dr Fozia M Khan Internal medicine

## 2023-07-26 LAB — CULTURE, URINE COMPREHENSIVE

## 2023-07-27 ENCOUNTER — Ambulatory Visit: Payer: Self-pay | Admitting: Physician Assistant

## 2023-07-27 MED ORDER — NITROFURANTOIN MONOHYD MACRO 100 MG PO CAPS: ORAL_CAPSULE | ORAL | 0 refills | Status: DC

## 2023-07-27 NOTE — Telephone Encounter (Signed)
 Spoke with patient regarding urine culture results. Advised to stay hydrated, Lauren sent Macrobid  BID.

## 2023-07-27 NOTE — Telephone Encounter (Signed)
-----   Message from Tinnie MARLA Pro sent at 07/27/2023 12:49 PM EDT ----- Please let her know that urine culture showed mixed flora which typically doesn't require ABX ----- Message ----- From: Almer Bi, CMA Sent: 07/23/2023   3:56 PM EDT To: Tinnie MARLA Pro, PA-C

## 2023-08-12 ENCOUNTER — Encounter (HOSPITAL_COMMUNITY): Payer: Self-pay

## 2023-08-12 ENCOUNTER — Ambulatory Visit (HOSPITAL_COMMUNITY)
Admission: EM | Admit: 2023-08-12 | Discharge: 2023-08-12 | Disposition: A | Discharge status: Home / Self Care | Attending: Emergency Medicine | Admitting: Emergency Medicine

## 2023-08-12 DIAGNOSIS — T464X5A Adverse effect of angiotensin-converting-enzyme inhibitors, initial encounter: Secondary | ICD-10-CM

## 2023-08-12 DIAGNOSIS — T783XXA Angioneurotic edema, initial encounter: Secondary | ICD-10-CM

## 2023-08-12 MED ORDER — METHYLPREDNISOLONE ACETATE 80 MG/ML IJ SUSP
80.0000 mg | Freq: Once | INTRAMUSCULAR | Status: AC
Start: 2023-08-12 — End: 2023-08-12
  Administered 2023-08-12: 80 mg via INTRAMUSCULAR

## 2023-08-12 MED ORDER — AMLODIPINE BESYLATE 10 MG PO TABS: 10.0000 mg | ORAL_TABLET | Freq: Every day | ORAL | 1 refills | Status: DC

## 2023-08-12 MED ORDER — METHYLPREDNISOLONE ACETATE 80 MG/ML IJ SUSP
INTRAMUSCULAR | Status: AC
Start: 2023-08-12 — End: 2023-08-12
  Filled 2023-08-12: qty 1

## 2023-08-12 MED ORDER — CETIRIZINE HCL 10 MG PO TABS: 10.0000 mg | ORAL_TABLET | Freq: Two times a day (BID) | ORAL | 0 refills | Status: AC

## 2023-08-12 MED ORDER — MONTELUKAST SODIUM 10 MG PO TABS: 10.0000 mg | ORAL_TABLET | Freq: Every day | ORAL | 0 refills | Status: DC

## 2023-08-12 MED ORDER — HYDROCHLOROTHIAZIDE 25 MG PO TABS: 25.0000 mg | ORAL_TABLET | Freq: Every morning | ORAL | 1 refills | Status: DC

## 2023-08-12 MED ORDER — METHYLPREDNISOLONE 4 MG PO TBPK
ORAL_TABLET | ORAL | 0 refills | Status: DC
Start: 2023-08-12 — End: 2023-09-03

## 2023-08-12 NOTE — Discharge Instructions (Signed)
 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  Please stop lisinopril  at this time.  For management of your blood pressure, I have increased your dose of amlodipine  to 10 mg daily and have added a second medication called hydrochlorothiazide  which works really well with amlodipine  controlling blood pressure.  Please take 1 tablet of each every morning until you have been instructed otherwise by your primary care provider.  Please read below to learn more about the medications, dosages and frequencies that I recommend to help alleviate your angioedema:  Depo-Medrol  IM (methylprednisolone ):  To quickly address your significant lip swelling, you were provided with an injection of Solu-Medrol  in the office today.  You should continue to feel the full benefit of the steroid for the next 8 to 12 hours.    Zyrtec  (cetirizine ): This is an excellent second-generation antihistamine that helps to reduce respiratory inflammatory response to environmental allergens.  For treatment of angioedema, please take 1 tablet twice daily, 1 in the morning and 1 at night.  If your insurance will not cover this medication, please feel free to purchase it over-the-counter.  Is important that you begin taking this medication this evening.  Singulair  (montelukast ): This is a mast cell stabilizer that works well with antihistamines.  Mast cells are responsible for stimulating histamine production which causes swelling.  Singulair  is only taken once daily.  I recommend that you take Singulair  in the evening with your evening dose of Zyrtec .   If you find that your health insurance will not pay for allergy medications, please consider downloading the GoodRx app and using to get a better price than the off the shelf price.     If symptoms return any time in the next 72 hours, please go to the emergency room for immediate evaluation.  If you experience tongue swelling or  throat closing, please call 911.   Thank you for visiting urgent care today.  We appreciate the opportunity to participate in your care.

## 2023-08-12 NOTE — ED Provider Notes (Signed)
 [facility]    CSN: [id number] Arrival date & time: 08/12/23  1645    HISTORY  No chief complaint on file.  HPI Tara Estes is a pleasant, 33 y.o. female who presents to urgent care today. Patient complains of swelling of the right side of her lower lip since 1 PM today.  Patient states that around 220, she took Benadryl  25 mg and applied ice to her lip with no improvement of the swelling.  Patient reports currently taking lisinopril  and has been taking this for the past 2 years without incident.  Patient denies swelling of her tongue and throat, SOB, or difficulty swallowing.  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  Patient states she has not been eating any new foods has not been exposed to any new personal hygiene products, denies injury to the lower lip, known exposure to plant or chemical irritants.   The history is provided by the patient.   Past Medical History:  Diagnosis Date   Arthritis    Bipolar 1 disorder (HCC)    younger, no meds now   Diabetes mellitus    Endometriosis    GERD (gastroesophageal reflux disease)    Headache    Hypertension    Patient Active Problem List   Diagnosis Date Noted   Positive ANA (antinuclear antibody) 05/10/2021   Elevated sed rate 05/10/2021   Polyarthritis 05/10/2021   Post-operative state 04/02/2019   Endometriosis    Pelvic adhesive disease    S/P laparoscopic surgery 03/07/2019   Past Surgical History:  Procedure Laterality Date   CYSTOSCOPY W/ URETERAL STENT PLACEMENT Bilateral 04/02/2019   Procedure: CYSTOSCOPY WITH RETROGRADE PYELOGRAM/URETERAL STENT PLACEMENT;  Surgeon: Penne Knee, MD;  Location: [facility];  Service: Urology;  Laterality: Bilateral;   CYSTOSCOPY W/ URETERAL STENT REMOVAL Bilateral 04/02/2019   Procedure: CYSTOSCOPY WITH STENT REMOVAL;  Surgeon: Ward, Mitzie BROCKS, MD;  Location: [facility];  Service: Gynecology;   Laterality: Bilateral;   head injury     requiring closure   LAPAROSCOPY N/A 03/07/2019   Procedure: LAPAROSCOPY DIAGNOSTIC, lysis of adhesions;  Surgeon: Ward, Mitzie BROCKS, MD;  Location: [facility];  Service: Gynecology;  Laterality: N/A;   ROBOTIC ASSISTED LAPAROSCOPIC OVARIAN CYSTECTOMY Right 04/02/2019   Procedure: XI ROBOTIC ASSISTED LAPAROSCOPIC OVARIAN CYSTECTOMY;  Surgeon: Ward, Mitzie BROCKS, MD;  Location: [facility];  Service: Gynecology;  Laterality: Right;   ROBOTIC ASSISTED TOTAL HYSTERECTOMY WITH BILATERAL SALPINGO OOPHERECTOMY Bilateral 04/02/2019   Procedure: XI ROBOTIC ASSISTED TOTAL HYSTERECTOMY WITH LEFT OOPHORECTOMY, BILATERAL SALPINGECTOMY, OVARIOLYSIS, ENTEROLYSIS,;  Surgeon: Ward, Mitzie BROCKS, MD;  Location: [facility];  Service: Gynecology;  Laterality: Bilateral;   OB History   No obstetric history on file.    Home Medications    Prior to Admission medications   Medication Sig Start Date End Date Taking? Authorizing Provider  acetaminophen  (TYLENOL ) 500 MG tablet Take 1,000 mg by mouth every 6 (six) hours as needed.    [provider]  albuterol  (VENTOLIN  HFA) 108 (90 Base) MCG/ACT inhaler Inhale 2 puffs into the lungs every 4 (four) hours as needed for wheezing or shortness of breath. 03/22/23   McDonough, Tinnie POUR, PA-C  amLODipine  (NORVASC ) 5 MG tablet Take 1 tablet (5 mg total) by mouth daily. 07/23/23   McDonough, Tinnie POUR, PA-C  celecoxib  (CELEBREX ) 200 MG capsule Take 1 capsule (200 mg total) by mouth 2 (two) times daily. 06/27/23   Liana Fish, NP  cyclobenzaprine  (FLEXERIL ) 10 MG tablet Take 1 tablet (10 mg total) by mouth at bedtime. Take one tab po qhs for back spasm prn only 06/18/23   McDonough, Lauren K, PA-C  etonogestrel (NEXPLANON) 68 MG IMPL implant 1 each by Subdermal route once.    [provider]  lisinopril  (ZESTRIL ) 20 MG tablet Take 1 tablet (20 mg total) by mouth daily. 05/21/23   McDonough, Tinnie POUR, PA-C  nitrofurantoin ,  macrocrystal-monohydrate, (MACROBID ) 100 MG capsule Take 1 cap twice per day for 10 days. 07/27/23   McDonough, Tinnie POUR, PA-C  omeprazole  (PRILOSEC) 40 MG capsule Take 1 capsule (40 mg total) by mouth daily. Patient not taking: Reported on 08/12/2023 03/22/23   Kristina Tinnie POUR, PA-C  predniSONE  (STERAPRED UNI-PAK 21 TAB) 10 MG (21) TBPK tablet Use as directed for 6 days 06/27/23   Liana Fish, NP  tirzepatide  (MOUNJARO ) 2.5 MG/0.5ML Pen Inject 2.5 mg into the skin once a week. 07/23/23   McDonough, Tinnie POUR, PA-C    Family History Family History  Problem Relation Age of Onset   Hypertension Mother    Hypertension Father    Heart disease Father    Bipolar disorder Father    Social History Social History   Tobacco Use   Smoking status: Some Days    Current packs/day: 0.10    Average packs/day: 0.1 packs/day for 12.0 years (1.2 ttl pk-yrs)    Types: E-cigarettes, Cigarettes   Smokeless tobacco: Never  Vaping Use   Vaping status: Every Day   Substances: Flavoring  Substance Use Topics   Alcohol use: Yes    Comment: rarely   Drug use: No   Allergies   Lisinopril  and Other  Review of Systems Review of Systems Pertinent findings revealed after performing a 14 point review of systems has been noted in the history of present illness.  Physical Exam Vital Signs BP 129/87 (BP Location: Right Arm)   Pulse 96   Temp 98.4 F (36.9 C)   Resp 16   LMP 02/27/2019   SpO2 98%   No data found.  Physical Exam Vitals and nursing note reviewed.  Constitutional:      General: She is awake. She is not in acute distress.    Appearance: Normal appearance. She is well-developed and well-groomed. She is not ill-appearing.  HENT:     Head: Normocephalic.     Salivary Glands: Right salivary gland is not diffusely enlarged or tender. Left salivary gland is not diffusely enlarged or tender.     Right Ear: Hearing, tympanic membrane, ear canal and external ear normal.     Left Ear:  Hearing, tympanic membrane, ear canal and external ear normal.     Nose: Nose normal.     Mouth/Throat:     Lips: Pink.     Mouth: Angioedema present.     Tongue: No lesions. Tongue does not deviate from midline.     Palate: No mass and lesions.     Pharynx: Oropharynx is clear. Uvula midline.   Musculoskeletal:     Cervical back: Full passive range of motion without pain, normal range of motion and neck supple.  Lymphadenopathy:     Cervical: No cervical adenopathy.  Neurological:     Mental Status: She is alert.  Psychiatric:        Behavior: Behavior is cooperative.     Visual Acuity Right Eye Distance:   Left Eye Distance:   Bilateral Distance:    Right Eye Near:  Left Eye Near:    Bilateral Near:     UC Couse / Diagnostics / Procedures:     Radiology No results found.  Procedures Procedures (including critical care time) EKG  Pending results:  Labs Reviewed - No data to display  Medications Ordered in UC: Medications  methylPREDNISolone  acetate (DEPO-MEDROL ) injection 80 mg (80 mg Intramuscular Given 08/12/23 1815)    UC Diagnoses / Final Clinical Impressions(s)   I have reviewed the triage vital signs and the nursing notes.  Pertinent labs & imaging results that were available during my care of the patient were reviewed by me and considered in my medical decision making (see chart for details).    Final diagnoses:  Angioedema of lips, initial encounter  Angioedema due to angiotensin converting enzyme inhibitor (ACE-I)   Patient provided with an injection of Depo-Medrol  during her visit today.  After 30 minutes, mild reduction in swelling was appreciated.  Patient provided with a tapering dose of methylprednisolone  as well.  Patient also encouraged to begin Zyrtec  10 mg twice daily and Singulair  for mast cell stabilization and decreased histamine production.    Patient advised that most likely cause of angioedema is lisinopril , patient advised to  discontinue use of lisinopril .  Patient provided with an increased dose of Norvasc  and HydroDIURIL  to replace lisinopril  and management of her blood pressure.  Close follow-up with primary care recommended for continued management of her blood pressure.  Emergency precautions advised.  Please see discharge instructions below for details of plan of care as provided to patient. ED Prescriptions     Medication Sig Dispense Auth. Provider   hydrochlorothiazide  (HYDRODIURIL ) 25 MG tablet Take 1 tablet (25 mg total) by mouth in the morning. 90 tablet Joesph Shaver Scales, PA-C   amLODipine  (NORVASC ) 10 MG tablet Take 1 tablet (10 mg total) by mouth daily. 90 tablet Joesph Shaver Scales, PA-C   methylPREDNISolone  (MEDROL  DOSEPAK) 4 MG TBPK tablet Take 24 mg on day 1, 20 mg on day 2, 16 mg on day 3, 12 mg on day 4, 8 mg on day 5, 4 mg on day 6.  Take all tablets in each row at once, do not spread tablets out throughout the day. 21 tablet Joesph Shaver Scales, PA-C   montelukast  (SINGULAIR ) 10 MG tablet Take 1 tablet (10 mg total) by mouth at bedtime. 30 tablet Joesph Shaver Scales, PA-C   cetirizine  (ZYRTEC ) 10 MG tablet Take 1 tablet (10 mg total) by mouth 2 (two) times daily. 60 tablet Joesph Shaver Scales, PA-C      PDMP not reviewed this encounter.  Pending results:  Labs Reviewed - No data to display    Discharge Instructions      The swelling in your lower lip is known as angioedema and has more than likely been caused by lisinopril .  Please stop lisinopril  at this time.  For management of your blood pressure, I have increased your dose of amlodipine  to 10 mg daily and have added a second medication called hydrochlorothiazide  which works really well with amlodipine  controlling blood pressure.  Please take 1 tablet of each every morning until you have been instructed otherwise by your primary care provider.  Please read below to learn more about the medications, dosages and  frequencies that I recommend to help alleviate your angioedema:  Depo-Medrol  IM (methylprednisolone ):  To quickly address your significant lip swelling, you were provided with an injection of Solu-Medrol  in the office today.  You should continue to feel the full  benefit of the steroid for the next 8 to 12 hours.    Zyrtec  (cetirizine ): This is an excellent second-generation antihistamine that helps to reduce respiratory inflammatory response to environmental allergens.  For treatment of angioedema, please take 1 tablet twice daily, 1 in the morning and 1 at night.  If your insurance will not cover this medication, please feel free to purchase it over-the-counter.  Is important that you begin taking this medication this evening.  Singulair  (montelukast ): This is a mast cell stabilizer that works well with antihistamines.  Mast cells are responsible for stimulating histamine production which causes swelling.  Singulair  is only taken once daily.  I recommend that you take Singulair  in the evening with your evening dose of Zyrtec .   If you find that your health insurance will not pay for allergy medications, please consider downloading the GoodRx app and using to get a better price than the off the shelf price.     If symptoms return any time in the next 72 hours, please go to the emergency room for immediate evaluation.  If you experience tongue swelling or throat closing, please call 911.   Thank you for visiting urgent care today.  We appreciate the opportunity to participate in your care.       Disposition Upon Discharge:  Condition: stable for discharge home  Patient presented with an acute illness with associated systemic symptoms and significant discomfort requiring urgent management. In my opinion, this is a condition that a prudent lay person (someone who possesses an average knowledge of health and medicine) may potentially expect to result in complications if not addressed urgently  such as respiratory distress, impairment of bodily function or dysfunction of bodily organs.   Routine symptom specific, illness specific and/or disease specific instructions were discussed with the patient and/or caregiver at length.   As such, the patient has been evaluated and assessed, work-up was performed and treatment was provided in alignment with urgent care protocols and evidence based medicine.  Patient/parent/caregiver has been advised that the patient may require follow up for further testing and treatment if the symptoms continue in spite of treatment, as clinically indicated and appropriate.  Patient/parent/caregiver has been advised to return to the [facility] or PCP if no better; to PCP or the Emergency Department if new signs and symptoms develop, or if the current signs or symptoms continue to change or worsen for further workup, evaluation and treatment as clinically indicated and appropriate  The patient will follow up with their current PCP if and as advised. If the patient does not currently have a PCP we will assist them in obtaining one.   The patient may need specialty follow up if the symptoms continue, in spite of conservative treatment and management, for further workup, evaluation, consultation and treatment as clinically indicated and appropriate.  Patient/parent/caregiver verbalized understanding and agreement of plan as discussed.  All questions were addressed during visit.  Please see discharge instructions below for further details of plan.  This office note has been dictated using Teaching laboratory technician.  Unfortunately, this method of dictation can sometimes lead to typographical or grammatical errors.  I apologize for your inconvenience in advance if this occurs.  Please do not hesitate to reach out to me if clarification is needed.      Joesph Shaver Scales, [place] 08/13/23 [phone]

## 2023-08-12 NOTE — ED Notes (Signed)
 Patient reports that she began having lower lip swelling since 1300 today. Patient states she took Benadryl  25 mg at 1420 today and has been icing her lip. Patient states she has been taking Lisinopril  x 2 years.  Patient denies swelling of her tongue and throat, SOB, or difficulty swallowing.

## 2023-08-13 ENCOUNTER — Encounter: Payer: Self-pay | Admitting: Physician Assistant

## 2023-08-13 NOTE — Telephone Encounter (Signed)
 Spoke with pt she already went to the urgent care they already stopped lisinopril  and gave increase hr amlodipine  and her BP is doing great 129/85 and she already had follow up with Lauren on aug 18 advised her keep track for bp and she see any changes call us  back

## 2023-08-18 ENCOUNTER — Other Ambulatory Visit: Payer: Self-pay | Admitting: Physician Assistant

## 2023-08-18 DIAGNOSIS — E1169 Type 2 diabetes mellitus with other specified complication: Secondary | ICD-10-CM

## 2023-08-19 ENCOUNTER — Other Ambulatory Visit: Payer: Self-pay | Admitting: Physician Assistant

## 2023-08-19 DIAGNOSIS — R252 Cramp and spasm: Secondary | ICD-10-CM

## 2023-08-20 ENCOUNTER — Telehealth: Payer: Self-pay

## 2023-08-20 ENCOUNTER — Other Ambulatory Visit: Payer: Self-pay

## 2023-08-21 ENCOUNTER — Other Ambulatory Visit: Payer: Self-pay | Admitting: Physician Assistant

## 2023-08-21 ENCOUNTER — Other Ambulatory Visit: Payer: Self-pay

## 2023-08-21 DIAGNOSIS — E1169 Type 2 diabetes mellitus with other specified complication: Secondary | ICD-10-CM

## 2023-08-21 MED ORDER — TIRZEPATIDE 5 MG/0.5ML ~~LOC~~ SOAJ
5.0000 mg | SUBCUTANEOUS | 2 refills | Status: DC
  Filled 2023-08-21: qty 2, 28d supply, fill #0

## 2023-08-21 NOTE — Telephone Encounter (Signed)
 Pt notified  that we sent

## 2023-09-03 ENCOUNTER — Other Ambulatory Visit: Payer: Self-pay

## 2023-09-03 ENCOUNTER — Encounter: Payer: Self-pay | Admitting: Physician Assistant

## 2023-09-03 ENCOUNTER — Ambulatory Visit (INDEPENDENT_AMBULATORY_CARE_PROVIDER_SITE_OTHER): Admitting: Physician Assistant

## 2023-09-03 VITALS — BP 129/80 | HR 78 | Temp 98.4°F | Resp 16 | Ht 68.0 in | Wt 366.6 lb

## 2023-09-03 DIAGNOSIS — Z6841 Body Mass Index (BMI) 40.0 and over, adult: Secondary | ICD-10-CM

## 2023-09-03 DIAGNOSIS — I10 Essential (primary) hypertension: Secondary | ICD-10-CM | POA: Diagnosis not present

## 2023-09-03 DIAGNOSIS — R252 Cramp and spasm: Secondary | ICD-10-CM

## 2023-09-03 DIAGNOSIS — M25561 Pain in right knee: Secondary | ICD-10-CM

## 2023-09-03 DIAGNOSIS — K219 Gastro-esophageal reflux disease without esophagitis: Secondary | ICD-10-CM

## 2023-09-03 DIAGNOSIS — E1169 Type 2 diabetes mellitus with other specified complication: Secondary | ICD-10-CM | POA: Diagnosis not present

## 2023-09-03 DIAGNOSIS — Z0001 Encounter for general adult medical examination with abnormal findings: Secondary | ICD-10-CM | POA: Diagnosis not present

## 2023-09-03 MED ORDER — TIRZEPATIDE 7.5 MG/0.5ML ~~LOC~~ SOAJ
7.5000 mg | SUBCUTANEOUS | 2 refills | Status: DC
  Filled 2023-09-03 – 2023-09-19 (×3): qty 2, 28d supply, fill #0
  Filled 2023-10-15: qty 2, 28d supply, fill #1

## 2023-09-03 MED ORDER — AMLODIPINE BESYLATE 10 MG PO TABS
10.0000 mg | ORAL_TABLET | Freq: Every day | ORAL | 1 refills | Status: AC
  Filled 2023-09-03 – 2023-09-05 (×3): qty 90, 90d supply, fill #0
  Filled 2024-01-06: qty 90, 90d supply, fill #1

## 2023-09-03 MED ORDER — CYCLOBENZAPRINE HCL 10 MG PO TABS
10.0000 mg | ORAL_TABLET | Freq: Every day | ORAL | 1 refills | Status: AC
  Filled 2023-09-03: qty 30, 30d supply, fill #0
  Filled 2023-10-15: qty 30, 30d supply, fill #1

## 2023-09-03 NOTE — Progress Notes (Addendum)
 [facility] [address] [place], [place] [address]  Internal MEDICINE  Office Visit Note  Patient Name: Tara Estes  [id number]  [id number]  Date of Service: 09/07/2023  Chief Complaint  Patient presents with   Annual Exam   Diabetes   Gastroesophageal Reflux   Hypertension    HPI Pt is here for annual exam -Had an allergic reaction to Lisinopril  and went to urgent care and had dose of norvasc  adjusted and hydrochlorothiazide  added at that time -BP now improved, taking amlodipine  10mg  daily. Has been taking hydrochlorothiazide  on weekend to help swelling but can't take on work days due to urinary frequency with diuretic. Discussed she could try cutting in half and taking as needed still -down 11lbs since last visit, tolerating mounjaro  and would like to increase -PT wants to extend sessions, went to ortho July 8. Waiting on workers comp to fill out work conditions,hasn't heard anything from workers comp -went on short trip in car for a funeral and right leg painful after being cramped in car. Difficult time even with this, much less work -Will bring [employer], will need intermittent leave through the end of year. Likely out 1-2 times per week due to pain and appts. -taking celebrex  and tylenol , muscle relaxer at night but can't take for work due to its effects and grogginess  Current Medication: Outpatient Encounter Medications as of 09/03/2023  Medication Sig   acetaminophen  (TYLENOL ) 500 MG tablet Take 1,000 mg by mouth every 6 (six) hours as needed.   albuterol  (VENTOLIN  HFA) 108 (90 Base) MCG/ACT inhaler Inhale 2 puffs into the lungs every 4 (four) hours as needed for wheezing or shortness of breath.   celecoxib  (CELEBREX ) 200 MG capsule Take 1 capsule (200 mg total) by mouth 2 (two) times daily.   cetirizine  (ZYRTEC ) 10 MG tablet Take 1 tablet (10 mg total) by mouth 2 (two) times daily.   etonogestrel (NEXPLANON) 68 MG IMPL implant 1 each by Subdermal route once.    hydrochlorothiazide  (HYDRODIURIL ) 25 MG tablet Take 1 tablet (25 mg total) by mouth in the morning.   montelukast  (SINGULAIR ) 10 MG tablet Take 1 tablet (10 mg total) by mouth at bedtime.   omeprazole  (PRILOSEC) 40 MG capsule Take 1 capsule (40 mg total) by mouth daily.   tirzepatide  (MOUNJARO ) 7.5 MG/0.5ML Pen Inject 7.5 mg into the skin once a week.   [DISCONTINUED] amLODipine  (NORVASC ) 10 MG tablet Take 1 tablet (10 mg total) by mouth daily.   [DISCONTINUED] cyclobenzaprine  (FLEXERIL ) 10 MG tablet TAKE 1 TABLET BY MOUTH AT BEDTIME AS NEEDED FOR MUSCLE SPASM ONLY   [DISCONTINUED] methylPREDNISolone  (MEDROL  DOSEPAK) 4 MG TBPK tablet Take 24 mg on day 1, 20 mg on day 2, 16 mg on day 3, 12 mg on day 4, 8 mg on day 5, 4 mg on day 6.  Take all tablets in each row at once, do not spread tablets out throughout the day.   [DISCONTINUED] tirzepatide  (MOUNJARO ) 5 MG/0.5ML Pen Inject 5 mg into the skin once a week.   amLODipine  (NORVASC ) 10 MG tablet Take 1 tablet (10 mg total) by mouth daily.   cyclobenzaprine  (FLEXERIL ) 10 MG tablet Take 1 tablet (10 mg total) by mouth at bedtime.   No facility-administered encounter medications on file as of 09/03/2023.    Surgical History: Past Surgical History:  Procedure Laterality Date   CYSTOSCOPY W/ URETERAL STENT PLACEMENT Bilateral 04/02/2019   Procedure: CYSTOSCOPY WITH RETROGRADE PYELOGRAM/URETERAL STENT PLACEMENT;  Surgeon: Penne Knee, MD;  Location: [facility];  Service: Urology;  Laterality: Bilateral;   CYSTOSCOPY W/ URETERAL STENT REMOVAL Bilateral 04/02/2019   Procedure: CYSTOSCOPY WITH STENT REMOVAL;  Surgeon: Ward, Mitzie BROCKS, MD;  Location: [facility];  Service: Gynecology;  Laterality: Bilateral;   head injury     requiring closure   LAPAROSCOPY N/A 03/07/2019   Procedure: LAPAROSCOPY DIAGNOSTIC, lysis of adhesions;  Surgeon: Ward, Mitzie BROCKS, MD;  Location: [facility];  Service: Gynecology;  Laterality: N/A;   ROBOTIC ASSISTED LAPAROSCOPIC OVARIAN  CYSTECTOMY Right 04/02/2019   Procedure: XI ROBOTIC ASSISTED LAPAROSCOPIC OVARIAN CYSTECTOMY;  Surgeon: Ward, Mitzie BROCKS, MD;  Location: [facility];  Service: Gynecology;  Laterality: Right;   ROBOTIC ASSISTED TOTAL HYSTERECTOMY WITH BILATERAL SALPINGO OOPHERECTOMY Bilateral 04/02/2019   Procedure: XI ROBOTIC ASSISTED TOTAL HYSTERECTOMY WITH LEFT OOPHORECTOMY, BILATERAL SALPINGECTOMY, OVARIOLYSIS, ENTEROLYSIS,;  Surgeon: Ward, Mitzie BROCKS, MD;  Location: [facility];  Service: Gynecology;  Laterality: Bilateral;    Medical History: Past Medical History:  Diagnosis Date   Arthritis    Bipolar 1 disorder (HCC)    younger, no meds now   Diabetes mellitus    Endometriosis    GERD (gastroesophageal reflux disease)    Headache    Hypertension     Family History: Family History  Problem Relation Age of Onset   Hypertension Mother    Hypertension Father    Heart disease Father    Bipolar disorder Father     Social History   Socioeconomic History   Marital status: Married    Spouse name: Not on file   Number of children: Not on file   Years of education: Not on file   Highest education level: Not on file  Occupational History   Not on file  Tobacco Use   Smoking status: Some Days    Current packs/day: 0.10    Average packs/day: 0.1 packs/day for 12.0 years (1.2 ttl pk-yrs)    Types: E-cigarettes, Cigarettes   Smokeless tobacco: Never  Vaping Use   Vaping status: Every Day   Substances: Flavoring  Substance and Sexual Activity   Alcohol use: Yes    Comment: rarely   Drug use: No   Sexual activity: Never  Other Topics Concern   Not on file  Social History Narrative   Not on file   Social Drivers of Health   Financial Resource Strain: Not on file  Food Insecurity: Not on file  Transportation Needs: Not on file  Physical Activity: Not on file  Stress: Not on file  Social Connections: Not on file  Intimate Partner Violence: Not on file      Review of Systems   Constitutional:  Positive for activity change and fatigue. Negative for chills and unexpected weight change.  HENT:  Negative for congestion, postnasal drip, rhinorrhea, sneezing and sore throat.   Eyes:  Negative for redness.  Respiratory:  Negative for cough, chest tightness and shortness of breath.   Cardiovascular:  Negative for chest pain and palpitations.  Gastrointestinal:  Negative for abdominal pain, constipation, diarrhea, nausea and vomiting.  Genitourinary:  Negative for dysuria and frequency.  Musculoskeletal:  Positive for arthralgias, back pain, gait problem and myalgias. Negative for neck pain.  Skin:  Negative for rash.  Neurological:  Negative for tremors and numbness.  Hematological:  Negative for adenopathy. Does not bruise/bleed easily.  Psychiatric/Behavioral:  Negative for behavioral problems (Depression) and suicidal ideas.     Vital Signs: BP 129/80   Pulse 78   Temp 98.4 F (36.9  C)   Resp 16   Ht 5' 8 (1.727 m)   Wt (!) 366 lb 9.6 oz (166.3 kg)   LMP 02/27/2019   SpO2 97%   BMI 55.74 kg/m    Physical Exam Vitals and nursing note reviewed.  Constitutional:      General: She is not in acute distress.    Appearance: Normal appearance. She is well-developed. She is obese. She is not diaphoretic.  HENT:     Head: Normocephalic and atraumatic.  Neck:     Thyroid : No thyromegaly.     Vascular: No JVD.     Trachea: No tracheal deviation.  Cardiovascular:     Rate and Rhythm: Normal rate and regular rhythm.     Heart sounds: Normal heart sounds. No murmur heard.    No friction rub. No gallop.  Pulmonary:     Effort: Pulmonary effort is normal. No respiratory distress.     Breath sounds: No wheezing or rales.  Chest:     Chest wall: No tenderness.  Skin:    General: Skin is warm and dry.  Neurological:     Mental Status: She is alert and oriented to person, place, and time.     Gait: Gait abnormal.  Psychiatric:        Behavior: Behavior  normal.        Thought Content: Thought content normal.        Judgment: Judgment normal.        Assessment/Plan: 1. Encounter for general adult medical examination with abnormal findings (Primary) CPE performed, labs previously reviewed  2. Type 2 diabetes mellitus with other specified complication, without long-term current use of insulin (HCC) (Primary) Doing well with mounjaro  and will increase dose - tirzepatide  (MOUNJARO ) 7.5 MG/0.5ML Pen; Inject 7.5 mg into the skin once a week.  Dispense: 2 mL; Refill: 2  3. Muscle cramping - cyclobenzaprine  (FLEXERIL ) 10 MG tablet; Take 1 tablet (10 mg total) by mouth at bedtime.  Dispense: 30 tablet; Refill: 1  4. Gastroesophageal reflux disease without esophagitis Doing better with change in foods  5. Essential (primary) hypertension Improved continue norvasc  daily and hydrochlorothiazide  prn (may cut in half) - amLODipine  (NORVASC ) 10 MG tablet; Take 1 tablet (10 mg total) by mouth daily.  Dispense: 90 tablet; Refill: 1  6. Pain of right knee after injury Followed by ortho and PT, FMLA paperwork to be completed  7. Morbid obesity with BMI of 50.0-59.9, adult (HCC) Down 11lbs and will continue to work on this   General Counseling: Zaire verbalizes understanding of the findings of todays visit and agrees with plan of treatment. I have discussed any further diagnostic evaluation that may be needed or ordered today. We also reviewed her medications today. she has been encouraged to call the office with any questions or concerns that should arise related to todays visit.    No orders of the defined types were placed in this encounter.   Meds ordered this encounter  Medications   tirzepatide  (MOUNJARO ) 7.5 MG/0.5ML Pen    Sig: Inject 7.5 mg into the skin once a week.    Dispense:  2 mL    Refill:  2    See dose change   amLODipine  (NORVASC ) 10 MG tablet    Sig: Take 1 tablet (10 mg total) by mouth daily.    Dispense:  90  tablet    Refill:  1   cyclobenzaprine  (FLEXERIL ) 10 MG tablet    Sig: Take 1 tablet (  10 mg total) by mouth at bedtime.    Dispense:  30 tablet    Refill:  1    This patient was seen by Tinnie Pro, PA-C in collaboration with Dr. Sigrid Bathe as a part of collaborative care agreement.   Total time spent:30 Minutes Time spent includes review of chart, medications, test results, and follow up plan with the patient.      Dr Fozia M Khan Internal medicine

## 2023-09-04 ENCOUNTER — Other Ambulatory Visit: Payer: Self-pay

## 2023-09-05 ENCOUNTER — Other Ambulatory Visit: Payer: Self-pay

## 2023-09-07 NOTE — Addendum Note (Signed)
 Addended by: Amberlea Spagnuolo on: 09/07/2023 02:02 PM   Modules accepted: Level of Service

## 2023-09-10 ENCOUNTER — Telehealth: Payer: Self-pay | Admitting: Physician Assistant

## 2023-09-10 NOTE — Telephone Encounter (Signed)
 FMLA paperwork completed. Faxed back to [employer]; [phone]. Scanned. Lvm notifying patient and she can pick up original @ front desk-Toni

## 2023-09-19 ENCOUNTER — Other Ambulatory Visit: Payer: Self-pay

## 2023-10-15 ENCOUNTER — Other Ambulatory Visit: Payer: Self-pay

## 2023-10-15 ENCOUNTER — Encounter: Payer: Self-pay | Admitting: Physician Assistant

## 2023-10-15 DIAGNOSIS — G8929 Other chronic pain: Secondary | ICD-10-CM

## 2023-10-15 MED ORDER — CELECOXIB 200 MG PO CAPS
200.0000 mg | ORAL_CAPSULE | Freq: Two times a day (BID) | ORAL | 3 refills | Status: AC
  Filled 2023-10-15: qty 60, 30d supply, fill #0
  Filled 2023-12-07: qty 60, 30d supply, fill #1
  Filled 2024-01-06: qty 60, 30d supply, fill #2
  Filled 2024-02-05: qty 60, 30d supply, fill #3

## 2023-10-23 ENCOUNTER — Other Ambulatory Visit: Payer: Self-pay | Admitting: Medical Genetics

## 2023-11-08 ENCOUNTER — Ambulatory Visit (INDEPENDENT_AMBULATORY_CARE_PROVIDER_SITE_OTHER): Admitting: Physician Assistant

## 2023-11-08 ENCOUNTER — Encounter: Payer: Self-pay | Admitting: Physician Assistant

## 2023-11-08 ENCOUNTER — Other Ambulatory Visit: Payer: Self-pay

## 2023-11-08 VITALS — BP 120/80 | HR 90 | Temp 97.8°F | Resp 16 | Ht 68.0 in | Wt 369.0 lb

## 2023-11-08 DIAGNOSIS — Z6841 Body Mass Index (BMI) 40.0 and over, adult: Secondary | ICD-10-CM

## 2023-11-08 DIAGNOSIS — I10 Essential (primary) hypertension: Secondary | ICD-10-CM

## 2023-11-08 DIAGNOSIS — M549 Dorsalgia, unspecified: Secondary | ICD-10-CM

## 2023-11-08 DIAGNOSIS — K219 Gastro-esophageal reflux disease without esophagitis: Secondary | ICD-10-CM | POA: Diagnosis not present

## 2023-11-08 DIAGNOSIS — M25561 Pain in right knee: Secondary | ICD-10-CM

## 2023-11-08 DIAGNOSIS — R0981 Nasal congestion: Secondary | ICD-10-CM

## 2023-11-08 DIAGNOSIS — E1169 Type 2 diabetes mellitus with other specified complication: Secondary | ICD-10-CM

## 2023-11-08 DIAGNOSIS — G8929 Other chronic pain: Secondary | ICD-10-CM

## 2023-11-08 LAB — POCT GLYCOSYLATED HEMOGLOBIN (HGB A1C): Hemoglobin A1C: 5.9 % — AB (ref 4.0–5.6)

## 2023-11-08 MED ORDER — OMEPRAZOLE 40 MG PO CPDR
40.0000 mg | DELAYED_RELEASE_CAPSULE | Freq: Every day | ORAL | 1 refills | Status: AC
Start: 2023-11-08 — End: ?
  Filled 2023-11-08: qty 90, 90d supply, fill #0

## 2023-11-08 MED ORDER — ACCU-CHEK GUIDE ME W/DEVICE KIT
PACK | 0 refills | Status: AC
  Filled 2023-11-08: qty 1, 30d supply, fill #0

## 2023-11-08 MED ORDER — TIRZEPATIDE 10 MG/0.5ML ~~LOC~~ SOAJ
10.0000 mg | SUBCUTANEOUS | 2 refills | Status: DC
  Filled 2023-11-08: qty 2, 28d supply, fill #0

## 2023-11-08 NOTE — Progress Notes (Signed)
 [facility] [address] [place], [place] [address]  Internal MEDICINE  Office Visit Note  Patient Name: Tara Estes  [id number]  [id number]  Date of Service: 11/08/2023  Chief Complaint  Patient presents with   Follow-up    HPI Pt is here for routine follow up -tolerating mounjaro  but would like to increase dose -will be seeing new ortho provider, knee pain and back pain -taking amlodipine  every night, only needed hydrochlorothiazide  twice when swollen since last visit  Current Medication: Outpatient Encounter Medications as of 11/08/2023  Medication Sig   albuterol  (VENTOLIN  HFA) 108 (90 Base) MCG/ACT inhaler Inhale 2 puffs into the lungs every 4 (four) hours as needed for wheezing or shortness of breath.   amLODipine  (NORVASC ) 10 MG tablet Take 1 tablet (10 mg total) by mouth daily.   celecoxib  (CELEBREX ) 200 MG capsule Take 1 capsule (200 mg total) by mouth 2 (two) times daily.   cetirizine  (ZYRTEC ) 10 MG tablet Take 1 tablet (10 mg total) by mouth 2 (two) times daily.   cyclobenzaprine  (FLEXERIL ) 10 MG tablet Take 1 tablet (10 mg total) by mouth at bedtime.   etonogestrel (NEXPLANON) 68 MG IMPL implant 1 each by Subdermal route once.   montelukast  (SINGULAIR ) 10 MG tablet Take 1 tablet (10 mg total) by mouth at bedtime.   omeprazole  (PRILOSEC) 40 MG capsule Take 1 capsule (40 mg total) by mouth daily.   tirzepatide  (MOUNJARO ) 7.5 MG/0.5ML Pen Inject 7.5 mg into the skin once a week.   [DISCONTINUED] acetaminophen  (TYLENOL ) 500 MG tablet Take 1,000 mg by mouth every 6 (six) hours as needed.   [DISCONTINUED] hydrochlorothiazide  (HYDRODIURIL ) 25 MG tablet Take 1 tablet (25 mg total) by mouth in the morning. (Patient not taking: Reported on 11/08/2023)   No facility-administered encounter medications on file as of 11/08/2023.    Surgical History: Past Surgical History:  Procedure Laterality Date   CYSTOSCOPY W/ URETERAL STENT PLACEMENT Bilateral 04/02/2019    Procedure: CYSTOSCOPY WITH RETROGRADE PYELOGRAM/URETERAL STENT PLACEMENT;  Surgeon: Penne Knee, MD;  Location: [facility];  Service: Urology;  Laterality: Bilateral;   CYSTOSCOPY W/ URETERAL STENT REMOVAL Bilateral 04/02/2019   Procedure: CYSTOSCOPY WITH STENT REMOVAL;  Surgeon: Ward, Mitzie BROCKS, MD;  Location: [facility];  Service: Gynecology;  Laterality: Bilateral;   head injury     requiring closure   LAPAROSCOPY N/A 03/07/2019   Procedure: LAPAROSCOPY DIAGNOSTIC, lysis of adhesions;  Surgeon: Ward, Mitzie BROCKS, MD;  Location: [facility];  Service: Gynecology;  Laterality: N/A;   ROBOTIC ASSISTED LAPAROSCOPIC OVARIAN CYSTECTOMY Right 04/02/2019   Procedure: XI ROBOTIC ASSISTED LAPAROSCOPIC OVARIAN CYSTECTOMY;  Surgeon: Ward, Mitzie BROCKS, MD;  Location: [facility];  Service: Gynecology;  Laterality: Right;   ROBOTIC ASSISTED TOTAL HYSTERECTOMY WITH BILATERAL SALPINGO OOPHERECTOMY Bilateral 04/02/2019   Procedure: XI ROBOTIC ASSISTED TOTAL HYSTERECTOMY WITH LEFT OOPHORECTOMY, BILATERAL SALPINGECTOMY, OVARIOLYSIS, ENTEROLYSIS,;  Surgeon: Ward, Mitzie BROCKS, MD;  Location: [facility];  Service: Gynecology;  Laterality: Bilateral;    Medical History: Past Medical History:  Diagnosis Date   Arthritis    Bipolar 1 disorder (HCC)    younger, no meds now   Diabetes mellitus    Endometriosis    GERD (gastroesophageal reflux disease)    Headache    Hypertension     Family History: Family History  Problem Relation Age of Onset   Hypertension Mother    Hypertension Father    Heart disease Father    Bipolar disorder Father     Social History  Socioeconomic History   Marital status: Married    Spouse name: Not on file   Number of children: Not on file   Years of education: Not on file   Highest education level: Not on file  Occupational History   Not on file  Tobacco Use   Smoking status: Some Days    Current packs/day: 0.10    Average packs/day: 0.1 packs/day for 12.0 years (1.2 ttl pk-yrs)     Types: E-cigarettes, Cigarettes   Smokeless tobacco: Never  Vaping Use   Vaping status: Every Day   Substances: Flavoring  Substance and Sexual Activity   Alcohol use: Yes    Comment: rarely   Drug use: No   Sexual activity: Never  Other Topics Concern   Not on file  Social History Narrative   Not on file   Social Drivers of Health   Financial Resource Strain: Not on file  Food Insecurity: Not on file  Transportation Needs: Not on file  Physical Activity: Not on file  Stress: Not on file  Social Connections: Not on file  Intimate Partner Violence: Not on file      Review of Systems  Constitutional:  Positive for activity change and fatigue. Negative for chills and unexpected weight change.  HENT:  Negative for congestion, postnasal drip, rhinorrhea, sneezing and sore throat.   Eyes:  Negative for redness.  Respiratory:  Negative for cough, chest tightness and shortness of breath.   Cardiovascular:  Negative for chest pain and palpitations.  Gastrointestinal:  Negative for abdominal pain, constipation, diarrhea, nausea and vomiting.  Genitourinary:  Negative for dysuria and frequency.  Musculoskeletal:  Positive for arthralgias, back pain and myalgias. Negative for neck pain.  Skin:  Negative for rash.  Neurological:  Negative for tremors and numbness.  Hematological:  Negative for adenopathy. Does not bruise/bleed easily.  Psychiatric/Behavioral:  Negative for behavioral problems (Depression) and suicidal ideas.     Vital Signs: Resp 16   Ht 5' 8 (1.727 m)   Wt (!) 369 lb (167.4 kg)   LMP 02/27/2019   BMI 56.11 kg/m    Physical Exam Vitals and nursing note reviewed.  Constitutional:      General: She is not in acute distress.    Appearance: Normal appearance. She is well-developed. She is obese. She is not diaphoretic.  HENT:     Head: Normocephalic and atraumatic.  Neck:     Thyroid : No thyromegaly.     Vascular: No JVD.     Trachea: No tracheal  deviation.  Cardiovascular:     Rate and Rhythm: Normal rate and regular rhythm.     Heart sounds: Normal heart sounds. No murmur heard.    No friction rub. No gallop.  Pulmonary:     Effort: Pulmonary effort is normal. No respiratory distress.     Breath sounds: No wheezing or rales.  Chest:     Chest wall: No tenderness.  Skin:    General: Skin is warm and dry.  Neurological:     Mental Status: She is alert and oriented to person, place, and time.  Psychiatric:        Behavior: Behavior normal.        Thought Content: Thought content normal.        Judgment: Judgment normal.        Assessment/Plan:   General Counseling: Landy verbalizes understanding of the findings of todays visit and agrees with plan of treatment. I have discussed any further diagnostic evaluation  that may be needed or ordered today. We also reviewed her medications today. she has been encouraged to call the office with any questions or concerns that should arise related to todays visit.    Orders Placed This Encounter  Procedures   POCT HgB A1C    No orders of the defined types were placed in this encounter.   This patient was seen by Tinnie Pro, PA-C in collaboration with Dr. Sigrid Bathe as a part of collaborative care agreement.   Total time spent:*** Minutes Time spent includes review of chart, medications, test results, and follow up plan with the patient.      Dr Fozia M Khan Internal medicine

## 2023-11-14 ENCOUNTER — Other Ambulatory Visit: Payer: Self-pay

## 2023-11-14 MED ORDER — ACCU-CHEK GUIDE TEST VI STRP
1.0000 | ORAL_STRIP | Freq: Two times a day (BID) | 3 refills | Status: AC
  Filled 2023-11-14: qty 100, 50d supply, fill #0

## 2023-11-14 MED ORDER — FREESTYLE FREEDOM LITE W/DEVICE KIT
PACK | 0 refills | Status: AC
  Filled 2023-11-14: qty 1, 1d supply, fill #0

## 2023-11-14 MED ORDER — FREESTYLE LANCETS MISC
Freq: Two times a day (BID) | 3 refills | Status: AC
  Filled 2023-11-14: qty 100, 50d supply, fill #0

## 2023-11-15 ENCOUNTER — Other Ambulatory Visit: Payer: Self-pay

## 2023-12-06 ENCOUNTER — Telehealth: Payer: Self-pay

## 2023-12-06 ENCOUNTER — Other Ambulatory Visit (HOSPITAL_COMMUNITY): Payer: Self-pay

## 2023-12-07 ENCOUNTER — Other Ambulatory Visit: Payer: Self-pay

## 2023-12-07 ENCOUNTER — Other Ambulatory Visit: Payer: Self-pay | Admitting: Physician Assistant

## 2023-12-07 DIAGNOSIS — E1169 Type 2 diabetes mellitus with other specified complication: Secondary | ICD-10-CM

## 2023-12-07 MED ORDER — TIRZEPATIDE 12.5 MG/0.5ML ~~LOC~~ SOAJ
12.5000 mg | SUBCUTANEOUS | 2 refills | Status: DC
  Filled 2023-12-07: qty 2, 28d supply, fill #0

## 2023-12-10 NOTE — Telephone Encounter (Signed)
Pt notified that we sent med

## 2023-12-12 ENCOUNTER — Other Ambulatory Visit: Payer: Self-pay

## 2023-12-16 ENCOUNTER — Other Ambulatory Visit: Payer: Self-pay

## 2024-01-03 ENCOUNTER — Other Ambulatory Visit: Payer: Self-pay

## 2024-01-03 ENCOUNTER — Ambulatory Visit: Admitting: Physician Assistant

## 2024-01-03 VITALS — BP 113/70 | HR 95 | Temp 98.0°F | Resp 16 | Ht 68.0 in | Wt 366.0 lb

## 2024-01-03 DIAGNOSIS — J324 Chronic pansinusitis: Secondary | ICD-10-CM

## 2024-01-03 DIAGNOSIS — I10 Essential (primary) hypertension: Secondary | ICD-10-CM | POA: Diagnosis not present

## 2024-01-03 DIAGNOSIS — E1169 Type 2 diabetes mellitus with other specified complication: Secondary | ICD-10-CM | POA: Diagnosis not present

## 2024-01-03 DIAGNOSIS — R062 Wheezing: Secondary | ICD-10-CM

## 2024-01-03 DIAGNOSIS — Z6841 Body Mass Index (BMI) 40.0 and over, adult: Secondary | ICD-10-CM | POA: Diagnosis not present

## 2024-01-03 MED ORDER — HYDRALAZINE HCL 10 MG PO TABS
10.0000 mg | ORAL_TABLET | Freq: Three times a day (TID) | ORAL | 1 refills | Status: AC | PRN
  Filled 2024-01-03: qty 90, 30d supply, fill #0

## 2024-01-03 MED ORDER — ALBUTEROL SULFATE HFA 108 (90 BASE) MCG/ACT IN AERS
2.0000 | INHALATION_SPRAY | RESPIRATORY_TRACT | 1 refills | Status: AC | PRN
  Filled 2024-01-03: qty 6.7, 25d supply, fill #0

## 2024-01-03 MED ORDER — AMOXICILLIN-POT CLAVULANATE 875-125 MG PO TABS
1.0000 | ORAL_TABLET | Freq: Two times a day (BID) | ORAL | 0 refills | Status: AC
  Filled 2024-01-03: qty 20, 10d supply, fill #0

## 2024-01-03 MED ORDER — TIRZEPATIDE 15 MG/0.5ML ~~LOC~~ SOAJ
15.0000 mg | SUBCUTANEOUS | 2 refills | Status: AC
  Filled 2024-01-03: qty 2, 28d supply, fill #0
  Filled 2024-02-05 – 2024-02-06 (×2): qty 2, 28d supply, fill #1

## 2024-01-03 NOTE — Progress Notes (Cosign Needed)
 [facility] [address] [place], [place] [address]  Internal MEDICINE  Office Visit Note  Patient Name: Tara Estes  [id number]  [id number]  Date of Service: 01/03/2024  Chief Complaint  Patient presents with   Follow-up   Diabetes   Hypertension   Gastroesophageal Reflux    HPI Pt is here for routine follow up -tolerating mounjaro  well, but would like to increase further. Does notice appetite suppressed, but not much  weight loss which may be due to inability to exercise any right now -BP can fluctuate up when pain kicks in from her leg; will be getting MRI on the second, got another steroid shot which didn't help, but planning to inject directly into area of concern based on MRI -she is on restrictions not lifting more than 10lbs and not walking more than 556ft at a time -still seeing therapist -still having chronic congestion. Has tried nasal spray, sinus rinse, and zyrtec /singulair , but still having sinus pressure ongoing for months.   Current Medication: Outpatient Encounter Medications as of 01/03/2024  Medication Sig   amLODipine  (NORVASC ) 10 MG tablet Take 1 tablet (10 mg total) by mouth daily.   amoxicillin -clavulanate (AUGMENTIN ) 875-125 MG tablet Take 1 tablet by mouth 2 (two) times daily. Take with food.   Blood Glucose Monitoring Suppl (ACCU-CHEK GUIDE ME) w/Device KIT Use as directed. E11.65   Blood Glucose Monitoring Suppl (FREESTYLE FREEDOM LITE) w/Device KIT Use as directed   celecoxib  (CELEBREX ) 200 MG capsule Take 1 capsule (200 mg total) by mouth 2 (two) times daily.   cetirizine  (ZYRTEC ) 10 MG tablet Take 1 tablet (10 mg total) by mouth 2 (two) times daily.   cyclobenzaprine  (FLEXERIL ) 10 MG tablet Take 1 tablet (10 mg total) by mouth at bedtime.   etonogestrel (NEXPLANON) 68 MG IMPL implant 1 each by Subdermal route once.   glucose blood (ACCU-CHEK GUIDE TEST) test strip Use as instructed twice a daily   hydrALAZINE  (APRESOLINE ) 10 MG  tablet Take 1 tablet (10 mg total) by mouth 3 (three) times daily as needed.   Lancets (FREESTYLE) lancets Use 2 (two) times daily.   omeprazole  (PRILOSEC) 40 MG capsule Take 1 capsule (40 mg total) by mouth daily.   tirzepatide  (MOUNJARO ) 15 MG/0.5ML Pen Inject 15 mg into the skin once a week.   [DISCONTINUED] albuterol  (VENTOLIN  HFA) 108 (90 Base) MCG/ACT inhaler Inhale 2 puffs into the lungs every 4 (four) hours as needed for wheezing or shortness of breath.   [DISCONTINUED] montelukast  (SINGULAIR ) 10 MG tablet Take 1 tablet (10 mg total) by mouth at bedtime.   [DISCONTINUED] tirzepatide  (MOUNJARO ) 12.5 MG/0.5ML Pen Inject 12.5 mg into the skin once a week.   albuterol  (VENTOLIN  HFA) 108 (90 Base) MCG/ACT inhaler Inhale 2 puffs into the lungs every 4 (four) hours as needed for wheezing or shortness of breath.   No facility-administered encounter medications on file as of 01/03/2024.    Surgical History: Past Surgical History:  Procedure Laterality Date   CYSTOSCOPY W/ URETERAL STENT PLACEMENT Bilateral 04/02/2019   Procedure: CYSTOSCOPY WITH RETROGRADE PYELOGRAM/URETERAL STENT PLACEMENT;  Surgeon: Penne Knee, MD;  Location: [facility];  Service: Urology;  Laterality: Bilateral;   CYSTOSCOPY W/ URETERAL STENT REMOVAL Bilateral 04/02/2019   Procedure: CYSTOSCOPY WITH STENT REMOVAL;  Surgeon: Ward, Mitzie BROCKS, MD;  Location: [facility];  Service: Gynecology;  Laterality: Bilateral;   head injury     requiring closure   LAPAROSCOPY N/A 03/07/2019   Procedure: LAPAROSCOPY DIAGNOSTIC, lysis of adhesions;  Surgeon:  Ward, Mitzie BROCKS, MD;  Location: [facility];  Service: Gynecology;  Laterality: N/A;   ROBOTIC ASSISTED LAPAROSCOPIC OVARIAN CYSTECTOMY Right 04/02/2019   Procedure: XI ROBOTIC ASSISTED LAPAROSCOPIC OVARIAN CYSTECTOMY;  Surgeon: Ward, Mitzie BROCKS, MD;  Location: [facility];  Service: Gynecology;  Laterality: Right;   ROBOTIC ASSISTED TOTAL HYSTERECTOMY WITH BILATERAL SALPINGO OOPHERECTOMY  Bilateral 04/02/2019   Procedure: XI ROBOTIC ASSISTED TOTAL HYSTERECTOMY WITH LEFT OOPHORECTOMY, BILATERAL SALPINGECTOMY, OVARIOLYSIS, ENTEROLYSIS,;  Surgeon: Ward, Mitzie BROCKS, MD;  Location: [facility];  Service: Gynecology;  Laterality: Bilateral;    Medical History: Past Medical History:  Diagnosis Date   Arthritis    Bipolar 1 disorder (HCC)    younger, no meds now   Diabetes mellitus    Endometriosis    GERD (gastroesophageal reflux disease)    Headache    Hypertension     Family History: Family History  Problem Relation Age of Onset   Hypertension Mother    Hypertension Father    Heart disease Father    Bipolar disorder Father     Social History   Socioeconomic History   Marital status: Married    Spouse name: Not on file   Number of children: Not on file   Years of education: Not on file   Highest education level: Not on file  Occupational History   Not on file  Tobacco Use   Smoking status: Some Days    Current packs/day: 0.10    Average packs/day: 0.1 packs/day for 12.0 years (1.2 ttl pk-yrs)    Types: E-cigarettes, Cigarettes   Smokeless tobacco: Never  Vaping Use   Vaping status: Every Day   Substances: Flavoring  Substance and Sexual Activity   Alcohol use: Yes    Comment: rarely   Drug use: No   Sexual activity: Never  Other Topics Concern   Not on file  Social History Narrative   Not on file   Social Drivers of Health   Tobacco Use: High Risk (01/03/2024)   Patient History    Smoking Tobacco Use: Some Days    Smokeless Tobacco Use: Never    Passive Exposure: Not on file  Financial Resource Strain: Not on file  Food Insecurity: Not on file  Transportation Needs: Not on file  Physical Activity: Not on file  Stress: Not on file  Social Connections: Not on file  Intimate Partner Violence: Not on file  Depression (PHQ2-9): Low Risk (01/03/2024)   Depression (PHQ2-9)    PHQ-2 Score: 0  Alcohol Screen: Low Risk (01/03/2024)   Alcohol Screen     Last Alcohol Screening Score (AUDIT): 1  Housing: Not on file  Utilities: Not on file  Health Literacy: Not on file      Review of Systems  Constitutional:  Positive for activity change and fatigue. Negative for chills and unexpected weight change.  HENT:  Positive for congestion, postnasal drip and sinus pressure. Negative for rhinorrhea and sneezing.   Eyes:  Negative for redness.  Respiratory:  Negative for cough, chest tightness and shortness of breath.   Cardiovascular:  Negative for chest pain and palpitations.  Gastrointestinal:  Negative for abdominal pain, constipation, diarrhea, nausea and vomiting.  Genitourinary:  Negative for dysuria and frequency.  Musculoskeletal:  Positive for arthralgias, back pain and myalgias. Negative for neck pain.  Skin:  Negative for rash.  Neurological:  Negative for tremors and numbness.  Hematological:  Negative for adenopathy. Does not bruise/bleed easily.  Psychiatric/Behavioral:  Negative for behavioral problems (Depression) and suicidal  ideas.     Vital Signs: BP 113/70   Pulse 95   Temp 98 F (36.7 C)   Resp 16   Ht 5' 8 (1.727 m)   Wt (!) 366 lb (166 kg)   LMP 02/27/2019   SpO2 99%   BMI 55.65 kg/m    Physical Exam Vitals and nursing note reviewed.  Constitutional:      General: She is not in acute distress.    Appearance: Normal appearance. She is well-developed. She is obese. She is not diaphoretic.  HENT:     Head: Normocephalic and atraumatic.     Nose: Congestion present.     Mouth/Throat:     Pharynx: Posterior oropharyngeal erythema present.  Neck:     Thyroid : No thyromegaly.     Vascular: No JVD.     Trachea: No tracheal deviation.  Cardiovascular:     Rate and Rhythm: Normal rate and regular rhythm.     Heart sounds: Normal heart sounds. No murmur heard.    No friction rub. No gallop.  Pulmonary:     Effort: Pulmonary effort is normal. No respiratory distress.     Breath sounds: No wheezing or  rales.  Chest:     Chest wall: No tenderness.  Skin:    General: Skin is warm and dry.  Neurological:     Mental Status: She is alert and oriented to person, place, and time.  Psychiatric:        Behavior: Behavior normal.        Thought Content: Thought content normal.        Judgment: Judgment normal.        Assessment/Plan: 1. Essential (primary) hypertension (Primary) Bp controlled in office, but can fluctuate at home. Will continue amlodipine  and may take hydralazine  prn when elevated - hydrALAZINE  (APRESOLINE ) 10 MG tablet; Take 1 tablet (10 mg total) by mouth 3 (three) times daily as needed.  Dispense: 90 tablet; Refill: 1  2. Type 2 diabetes mellitus with other specified complication, without long-term current use of insulin (HCC) Will increase to 15 mg weekly and continue to work on diet - tirzepatide  (MOUNJARO ) 15 MG/0.5ML Pen; Inject 15 mg into the skin once a week.  Dispense: 6 mL; Refill: 2  3. Chronic pansinusitis Will start on augmentin  and continue nasal rinse. May need ENT if still not improving - amoxicillin -clavulanate (AUGMENTIN ) 875-125 MG tablet; Take 1 tablet by mouth 2 (two) times daily. Take with food.  Dispense: 20 tablet; Refill: 0  4. Wheezing - albuterol  (VENTOLIN  HFA) 108 (90 Base) MCG/ACT inhaler; Inhale 2 puffs into the lungs every 4 (four) hours as needed for wheezing or shortness of breath.  Dispense: 6.7 g; Refill: 1  5. Morbid obesity with BMI of 50.0-59.9, adult (HCC) Down 3 lbs since last visit, continue to work on diet and will increase mounjaro    General Counseling: Emmali verbalizes understanding of the findings of todays visit and agrees with plan of treatment. I have discussed any further diagnostic evaluation that may be needed or ordered today. We also reviewed her medications today. she has been encouraged to call the office with any questions or concerns that should arise related to todays visit.    No orders of the defined  types were placed in this encounter.   Meds ordered this encounter  Medications   hydrALAZINE  (APRESOLINE ) 10 MG tablet    Sig: Take 1 tablet (10 mg total) by mouth 3 (three) times daily as needed.  Dispense:  90 tablet    Refill:  1   tirzepatide  (MOUNJARO ) 15 MG/0.5ML Pen    Sig: Inject 15 mg into the skin once a week.    Dispense:  6 mL    Refill:  2   albuterol  (VENTOLIN  HFA) 108 (90 Base) MCG/ACT inhaler    Sig: Inhale 2 puffs into the lungs every 4 (four) hours as needed for wheezing or shortness of breath.    Dispense:  6.7 g    Refill:  1   amoxicillin -clavulanate (AUGMENTIN ) 875-125 MG tablet    Sig: Take 1 tablet by mouth 2 (two) times daily. Take with food.    Dispense:  20 tablet    Refill:  0    This patient was seen by Tinnie Pro, PA-C in collaboration with Dr. Sigrid Bathe as a part of collaborative care agreement.   Total time spent:30 Minutes Time spent includes review of chart, medications, test results, and follow up plan with the patient.      Dr Fozia M Khan Internal medicine

## 2024-01-07 ENCOUNTER — Other Ambulatory Visit: Payer: Self-pay

## 2024-01-16 ENCOUNTER — Telehealth: Payer: Self-pay | Admitting: Physician Assistant

## 2024-01-16 ENCOUNTER — Encounter: Payer: Self-pay | Admitting: Physician Assistant

## 2024-01-16 NOTE — Telephone Encounter (Signed)
 Patient requesting end date of FMLA be updated to 01/15/2025. Printed previously completed form. Gave to [employer]

## 2024-01-18 ENCOUNTER — Telehealth: Payer: Self-pay | Admitting: Physician Assistant

## 2024-01-18 NOTE — Telephone Encounter (Signed)
 End of date on FMLA form updated. Faxed back to [employer]; [phone]. Scanned-Toni

## 2024-02-05 ENCOUNTER — Other Ambulatory Visit: Payer: Self-pay

## 2024-02-06 ENCOUNTER — Other Ambulatory Visit: Payer: Self-pay

## 2024-02-19 ENCOUNTER — Other Ambulatory Visit: Payer: Self-pay | Admitting: Physician Assistant

## 2024-02-19 ENCOUNTER — Encounter: Payer: Self-pay | Admitting: Physician Assistant

## 2024-02-19 DIAGNOSIS — J324 Chronic pansinusitis: Secondary | ICD-10-CM

## 2024-02-19 DIAGNOSIS — R0981 Nasal congestion: Secondary | ICD-10-CM

## 2024-02-19 NOTE — Telephone Encounter (Signed)
 ENT referral placed.

## 2024-02-20 ENCOUNTER — Telehealth: Payer: Self-pay | Admitting: Physician Assistant

## 2024-02-20 NOTE — Telephone Encounter (Signed)
 Otolaryngology referral sent via [employer] to [facility]. Telephone # [phone]. Levt patient vm-Toni

## 2024-02-21 ENCOUNTER — Telehealth: Payer: Self-pay | Admitting: Physician Assistant

## 2024-02-21 NOTE — Telephone Encounter (Signed)
 Otolaryngology appointment 02/22/2024 with [facility]-Toni

## 2024-02-22 ENCOUNTER — Other Ambulatory Visit: Admission: RE | Admit: 2024-02-22 | Source: Ambulatory Visit | Admitting: Otolaryngology

## 2024-02-22 ENCOUNTER — Other Ambulatory Visit: Payer: Self-pay | Admitting: Otolaryngology

## 2024-02-22 DIAGNOSIS — E041 Nontoxic single thyroid nodule: Secondary | ICD-10-CM

## 2024-03-06 ENCOUNTER — Ambulatory Visit: Admitting: Physician Assistant

## 2024-09-04 ENCOUNTER — Encounter: Admitting: Physician Assistant
